# Patient Record
Sex: Female | Born: 1990 | Race: White | Hispanic: No | Marital: Married | Smoking: Never smoker
Health system: Southern US, Community
[De-identification: ages and names within clinical notes are randomized; demographics above are authoritative.]

## PROBLEM LIST (undated history)

## (undated) ENCOUNTER — Inpatient Hospital Stay (HOSPITAL_COMMUNITY): Payer: Self-pay

## (undated) DIAGNOSIS — K219 Gastro-esophageal reflux disease without esophagitis: Secondary | ICD-10-CM

## (undated) DIAGNOSIS — D649 Anemia, unspecified: Secondary | ICD-10-CM

## (undated) DIAGNOSIS — E063 Autoimmune thyroiditis: Secondary | ICD-10-CM

## (undated) DIAGNOSIS — E042 Nontoxic multinodular goiter: Secondary | ICD-10-CM

## (undated) HISTORY — DX: Autoimmune thyroiditis: E06.3

## (undated) HISTORY — PX: SHOULDER SURGERY: SHX246

## (undated) HISTORY — PX: WISDOM TOOTH EXTRACTION: SHX21

---

## 2013-10-01 ENCOUNTER — Other Ambulatory Visit: Payer: Self-pay

## 2013-10-01 LAB — OB RESULTS CONSOLE HIV ANTIBODY (ROUTINE TESTING): HIV: NONREACTIVE

## 2013-10-01 LAB — OB RESULTS CONSOLE GC/CHLAMYDIA
CHLAMYDIA, DNA PROBE: NEGATIVE
GC PROBE AMP, GENITAL: NEGATIVE

## 2013-10-01 LAB — OB RESULTS CONSOLE ABO/RH: RH Type: POSITIVE

## 2013-10-01 LAB — OB RESULTS CONSOLE HEPATITIS B SURFACE ANTIGEN: Hepatitis B Surface Ag: NEGATIVE

## 2013-10-01 LAB — OB RESULTS CONSOLE ANTIBODY SCREEN: Antibody Screen: NEGATIVE

## 2013-10-01 LAB — OB RESULTS CONSOLE RPR: RPR: NONREACTIVE

## 2013-10-01 LAB — OB RESULTS CONSOLE RUBELLA ANTIBODY, IGM: Rubella: IMMUNE

## 2013-10-06 ENCOUNTER — Other Ambulatory Visit (HOSPITAL_COMMUNITY): Payer: Self-pay | Admitting: Obstetrics and Gynecology

## 2013-10-06 DIAGNOSIS — Z8279 Family history of other congenital malformations, deformations and chromosomal abnormalities: Secondary | ICD-10-CM

## 2013-11-30 ENCOUNTER — Encounter (HOSPITAL_COMMUNITY): Payer: Self-pay

## 2013-11-30 ENCOUNTER — Ambulatory Visit (HOSPITAL_COMMUNITY): Payer: BC Managed Care – PPO

## 2013-11-30 ENCOUNTER — Ambulatory Visit (HOSPITAL_COMMUNITY)
Admission: RE | Admit: 2013-11-30 | Discharge: 2013-11-30 | Disposition: A | Discharge status: Home / Self Care | Payer: BC Managed Care – PPO | Source: Ambulatory Visit | Attending: Obstetrics and Gynecology | Admitting: Obstetrics and Gynecology

## 2013-11-30 DIAGNOSIS — O352XX0 Maternal care for (suspected) hereditary disease in fetus, not applicable or unspecified: Secondary | ICD-10-CM | POA: Insufficient documentation

## 2013-11-30 DIAGNOSIS — O358XX0 Maternal care for other (suspected) fetal abnormality and damage, not applicable or unspecified: Secondary | ICD-10-CM | POA: Insufficient documentation

## 2013-11-30 DIAGNOSIS — Z1389 Encounter for screening for other disorder: Secondary | ICD-10-CM | POA: Insufficient documentation

## 2013-11-30 DIAGNOSIS — Z8279 Family history of other congenital malformations, deformations and chromosomal abnormalities: Secondary | ICD-10-CM

## 2013-11-30 DIAGNOSIS — Z363 Encounter for antenatal screening for malformations: Secondary | ICD-10-CM | POA: Insufficient documentation

## 2014-01-18 ENCOUNTER — Inpatient Hospital Stay (HOSPITAL_COMMUNITY)
Admission: AD | Admit: 2014-01-18 | Discharge: 2014-01-18 | Disposition: A | Discharge status: Home / Self Care | Payer: BC Managed Care – PPO | Source: Ambulatory Visit | Attending: Obstetrics and Gynecology | Admitting: Obstetrics and Gynecology

## 2014-01-18 ENCOUNTER — Encounter (HOSPITAL_COMMUNITY): Payer: Self-pay

## 2014-01-18 DIAGNOSIS — N898 Other specified noninflammatory disorders of vagina: Secondary | ICD-10-CM

## 2014-01-18 DIAGNOSIS — O26899 Other specified pregnancy related conditions, unspecified trimester: Secondary | ICD-10-CM

## 2014-01-18 DIAGNOSIS — Z91013 Allergy to seafood: Secondary | ICD-10-CM | POA: Insufficient documentation

## 2014-01-18 DIAGNOSIS — B9689 Other specified bacterial agents as the cause of diseases classified elsewhere: Secondary | ICD-10-CM

## 2014-01-18 DIAGNOSIS — N760 Acute vaginitis: Secondary | ICD-10-CM

## 2014-01-18 DIAGNOSIS — O368190 Decreased fetal movements, unspecified trimester, not applicable or unspecified: Secondary | ICD-10-CM | POA: Insufficient documentation

## 2014-01-18 HISTORY — DX: Nontoxic multinodular goiter: E04.2

## 2014-01-18 LAB — URINALYSIS, ROUTINE W REFLEX MICROSCOPIC
Bilirubin Urine: NEGATIVE
Glucose, UA: NEGATIVE mg/dL
HGB URINE DIPSTICK: NEGATIVE
Ketones, ur: NEGATIVE mg/dL
NITRITE: NEGATIVE
Protein, ur: NEGATIVE mg/dL
Specific Gravity, Urine: <1.005 — ABNORMAL LOW (ref 1.005–1.030)
UROBILINOGEN UA: 0.2 mg/dL (ref 0.0–1.0)
pH: 7 (ref 5.0–8.0)

## 2014-01-18 LAB — URINE MICROSCOPIC-ADD ON

## 2014-01-18 LAB — WET PREP, GENITAL
Trich, Wet Prep: NONE SEEN
YEAST WET PREP: NONE SEEN

## 2014-01-18 MED ORDER — METRONIDAZOLE 500 MG PO TABS: 500.0000 mg | ORAL_TABLET | Freq: Two times a day (BID) | ORAL | Status: AC

## 2014-01-18 NOTE — Discharge Instructions (Signed)
Second Trimester of Pregnancy The second trimester is from week 13 through week 28, months 4 through 6. The second trimester is often a time when you feel your best. Your body has also adjusted to being pregnant, and you begin to feel better physically. Usually, morning sickness has lessened or quit completely, you may have more energy, and you may have an increase in appetite. The second trimester is also a time when the fetus is growing rapidly. At the end of the sixth month, the fetus is about 9 inches long and weighs about 1 pounds. You will likely begin to feel the baby move (quickening) between 18 and 20 weeks of the pregnancy. BODY CHANGES Your body goes through many changes during pregnancy. The changes vary from woman to woman.   Your weight will continue to increase. You will notice your lower abdomen bulging out.  You may begin to get stretch marks on your hips, abdomen, and breasts.  You may develop headaches that can be relieved by medicines approved by your caregiver.  You may urinate more often because the fetus is pressing on your bladder.  You may develop or continue to have heartburn as a result of your pregnancy.  You may develop constipation because certain hormones are causing the muscles that push waste through your intestines to slow down.  You may develop hemorrhoids or swollen, bulging veins (varicose veins).  You may have back pain because of the weight gain and pregnancy hormones relaxing your joints between the bones in your pelvis and as a result of a shift in weight and the muscles that support your balance.  Your breasts will continue to grow and be tender.  Your gums may bleed and may be sensitive to brushing and flossing.  Dark spots or blotches (chloasma, mask of pregnancy) may develop on your face. This will likely fade after the baby is born.  A dark line from your belly button to the pubic area (linea nigra) may appear. This will likely fade after the  baby is born. WHAT TO EXPECT AT YOUR PRENATAL VISITS During a routine prenatal visit:  You will be weighed to make sure you and the fetus are growing normally.  Your blood pressure will be taken.  Your abdomen will be measured to track your baby's growth.  The fetal heartbeat will be listened to.  Any test results from the previous visit will be discussed. Your caregiver may ask you:  How you are feeling.  If you are feeling the baby move.  If you have had any abnormal symptoms, such as leaking fluid, bleeding, severe headaches, or abdominal cramping.  If you have any questions. Other tests that may be performed during your second trimester include:  Blood tests that check for:  Low iron levels (anemia).  Gestational diabetes (between 24 and 28 weeks).  Rh antibodies.  Urine tests to check for infections, diabetes, or protein in the urine.  An ultrasound to confirm the proper growth and development of the baby.  An amniocentesis to check for possible genetic problems.  Fetal screens for spina bifida and Down syndrome. HOME CARE INSTRUCTIONS   Avoid all smoking, herbs, alcohol, and unprescribed drugs. These chemicals affect the formation and growth of the baby.  Follow your caregiver's instructions regarding medicine use. There are medicines that are either safe or unsafe to take during pregnancy.  Exercise only as directed by your caregiver. Experiencing uterine cramps is a good sign to stop exercising.  Continue to eat regular,   healthy meals.  Wear a good support bra for breast tenderness.  Do not use hot tubs, steam rooms, or saunas.  Wear your seat belt at all times when driving.  Avoid raw meat, uncooked cheese, cat litter boxes, and soil used by cats. These carry germs that can cause birth defects in the baby.  Take your prenatal vitamins.  Try taking a stool softener (if your caregiver approves) if you develop constipation. Eat more high-fiber foods,  such as fresh vegetables or fruit and whole grains. Drink plenty of fluids to keep your urine clear or pale yellow.  Take warm sitz baths to soothe any pain or discomfort caused by hemorrhoids. Use hemorrhoid cream if your caregiver approves.  If you develop varicose veins, wear support hose. Elevate your feet for 15 minutes, 3 4 times a day. Limit salt in your diet.  Avoid heavy lifting, wear low heel shoes, and practice good posture.  Rest with your legs elevated if you have leg cramps or low back pain.  Visit your dentist if you have not gone yet during your pregnancy. Use a soft toothbrush to brush your teeth and be gentle when you floss.  A sexual relationship may be continued unless your caregiver directs you otherwise.  Continue to go to all your prenatal visits as directed by your caregiver. SEEK MEDICAL CARE IF:   You have dizziness.  You have mild pelvic cramps, pelvic pressure, or nagging pain in the abdominal area.  You have persistent nausea, vomiting, or diarrhea.  You have a bad smelling vaginal discharge.  You have pain with urination. SEEK IMMEDIATE MEDICAL CARE IF:   You have a fever.  You are leaking fluid from your vagina.  You have spotting or bleeding from your vagina.  You have severe abdominal cramping or pain.  You have rapid weight gain or loss.  You have shortness of breath with chest pain.  You notice sudden or extreme swelling of your face, hands, ankles, feet, or legs.  You have not felt your baby move in over an hour.  You have severe headaches that do not go away with medicine.  You have vision changes. Document Released: 10/22/2001 Document Revised: 06/30/2013 Document Reviewed: 12/29/2012 [employer] Patient Information 2014 [employer].  

## 2014-01-18 NOTE — MAU Note (Signed)
Last felt fetal movement last night before bed; faint movements yesterday. Denies contractions/vaginal bleeding. Change in vaginal discharge; clear & watery since yesterday. Denies fever.

## 2014-01-18 NOTE — MAU Provider Note (Signed)
History     CSN: [id number]  Arrival date and time: 01/18/14 4098   First Provider Initiated Contact with Patient 01/18/14 2004      Chief Complaint  Patient presents with  . Decreased Fetal Movement  . Vaginal Discharge   Vaginal Discharge The patient's primary symptoms include a vaginal discharge.    23 yo G1 @ [redacted]w[redacted]d who presents today for decreased FM and change in vaginal discharge.  States that starting yesterday, she has noticed that her discharge went from being thick to more watery and thin discharge. Then this AM, she noticed that her baby wasn't kicking as strongly or as often as earlier and the combination of the two made her worry.  No contractions or vaginal bleeding.    Since getting here baby has been moving normally again "almost as soon as the monitor was put on".    No fevers, chills, dysuria.    OB History   Grav Para Term Preterm Abortions TAB SAB Ect Mult Living   1         0      Past Medical History  Diagnosis Date  . Multiple thyroid nodules     Has to have them checked once a year    Past Surgical History  Procedure Laterality Date  . Shoulder surgery Right   . Wisdom tooth extraction      Family History  Problem Relation Age of Onset  . Hypertension Father     History  Substance Use Topics  . Smoking status: Never Smoker   . Smokeless tobacco: Never Used  . Alcohol Use: No    Allergies:  Allergies  Allergen Reactions  . Codeine   . Erythromycin   . Food     Potatoes and sesame seeds  . Shellfish Allergy     Prescriptions prior to admission  Medication Sig Dispense Refill  . Prenatal Vit-Fe Fumarate-FA (PRENATAL MULTIVITAMIN) TABS tablet Take 1 tablet by mouth daily at 12 noon.        Review of Systems  Genitourinary: Positive for vaginal discharge.  All other systems reviewed and are negative.   Physical Exam   Blood pressure 125/87, pulse 88, temperature 99.2 F (37.3 C), temperature source Oral, resp. rate 18,  height 5\' 5"  (1.651 m), weight 60.056 kg (132 lb 6.4 oz), last menstrual period 07/19/2013, SpO2 100.00%.  Physical Exam  Constitutional: She is oriented to person, place, and time. She appears well-developed and well-nourished.  HENT:  Mouth/Throat: Oropharynx is clear and moist.  Neck: Neck supple.  Cardiovascular: Normal rate, regular rhythm and normal heart sounds.   Respiratory: Effort normal and breath sounds normal.  GI: Soft. Bowel sounds are normal. She exhibits no distension. There is no tenderness. There is no rebound.  Genitourinary: Uterus is enlarged (appropriate for dates). Cervix exhibits friability (large ectropion). Cervix exhibits no discharge. Right adnexum displays no mass and no tenderness. Left adnexum displays no mass and no tenderness. No erythema, tenderness or bleeding around the vagina. Vaginal discharge (scant, watery ) found.  Negative pool and negative fern  Neurological: She is alert and oriented to person, place, and time.  Skin: Skin is warm and dry.    MAU Course  Procedures  MDM Wet prep Gc/chl Prolonged monitoring   Assessment and Plan  23 yo G1 @ [redacted]w[redacted]d who is here for change in vaginal discharge and decreased FM now resolved.   1) decreased FM - resolved upon arrival here - FHT 145, mod var,  10x10 accels, one variable. Reassuring for GA - reassurance given  2) vaginal discharge - normal appearance on exam - neg fern, neg pool - wet prep showing clue cells. Will treat with flagyl po BID x 7 days.  - gc/chl pending  D/c to home with return precautions. F/u in office as scheduled.    Discussed with Dr. Arelia SneddonMcComb.      Eulice Rutledge L 01/18/2014, 8:28 PM

## 2014-01-19 LAB — GC/CHLAMYDIA PROBE AMP
CT Probe RNA: NEGATIVE
GC Probe RNA: NEGATIVE

## 2014-02-17 ENCOUNTER — Other Ambulatory Visit (HOSPITAL_COMMUNITY): Payer: Self-pay | Admitting: Obstetrics and Gynecology

## 2014-02-17 DIAGNOSIS — O352XX0 Maternal care for (suspected) hereditary disease in fetus, not applicable or unspecified: Secondary | ICD-10-CM

## 2014-03-08 ENCOUNTER — Ambulatory Visit (HOSPITAL_COMMUNITY): Payer: BC Managed Care – PPO

## 2014-03-13 ENCOUNTER — Encounter (HOSPITAL_COMMUNITY): Payer: Self-pay

## 2014-03-13 ENCOUNTER — Inpatient Hospital Stay (HOSPITAL_COMMUNITY)
Admission: AD | Admit: 2014-03-13 | Discharge: 2014-03-13 | Disposition: A | Discharge status: Home / Self Care | Payer: BC Managed Care – PPO | Source: Ambulatory Visit | Attending: Obstetrics & Gynecology | Admitting: Obstetrics & Gynecology

## 2014-03-13 DIAGNOSIS — O479 False labor, unspecified: Secondary | ICD-10-CM

## 2014-03-13 DIAGNOSIS — O99891 Other specified diseases and conditions complicating pregnancy: Secondary | ICD-10-CM | POA: Insufficient documentation

## 2014-03-13 DIAGNOSIS — O4700 False labor before 37 completed weeks of gestation, unspecified trimester: Secondary | ICD-10-CM | POA: Insufficient documentation

## 2014-03-13 DIAGNOSIS — O9989 Other specified diseases and conditions complicating pregnancy, childbirth and the puerperium: Secondary | ICD-10-CM

## 2014-03-13 DIAGNOSIS — N898 Other specified noninflammatory disorders of vagina: Secondary | ICD-10-CM | POA: Insufficient documentation

## 2014-03-13 DIAGNOSIS — O26893 Other specified pregnancy related conditions, third trimester: Secondary | ICD-10-CM

## 2014-03-13 LAB — URINE MICROSCOPIC-ADD ON

## 2014-03-13 LAB — URINALYSIS, ROUTINE W REFLEX MICROSCOPIC
Bilirubin Urine: NEGATIVE
GLUCOSE, UA: NEGATIVE mg/dL
Hgb urine dipstick: NEGATIVE
KETONES UR: NEGATIVE mg/dL
NITRITE: NEGATIVE
PH: 7 (ref 5.0–8.0)
Protein, ur: NEGATIVE mg/dL
Specific Gravity, Urine: 1.02 (ref 1.005–1.030)
Urobilinogen, UA: 0.2 mg/dL (ref 0.0–1.0)

## 2014-03-13 NOTE — MAU Provider Note (Signed)
Chief Complaint:  lost mucus plug   First Provider Initiated Contact with Patient 03/13/14 1221     HPI: Alison Duran is a 23 y.o. G1P0 at 4420w6d who presents to maternity admissions reporting passing brown mucus yesterday. None today. Has been having Deberah PeltonBraxton Hicks for a few months. No increase in intensity or frequency. Unable to describe the frequency. Denies leakage of fluid. Good fetal movement. Placenta anterior per anatomy scan. Blood type A pos. No recent IC.   Pregnancy Course: Uncomplicated.  Past Medical History: Past Medical History  Diagnosis Date  . Multiple thyroid nodules     Has to have them checked once a year    Past obstetric history: OB History  Gravida Para Term Preterm AB SAB TAB Ectopic Multiple Living  1         0    # Outcome Date GA Lbr Len/2nd Weight Sex Delivery Anes PTL Lv  1 CUR               Past Surgical History: Past Surgical History  Procedure Laterality Date  . Shoulder surgery Right   . Wisdom tooth extraction       Family History: Family History  Problem Relation Age of Onset  . Hypertension Father     Social History: History  Substance Use Topics  . Smoking status: Never Smoker   . Smokeless tobacco: Never Used  . Alcohol Use: No    Allergies:  Allergies  Allergen Reactions  . Codeine Nausea And Vomiting  . Erythromycin Nausea And Vomiting  . Shellfish Allergy Other (See Comments)    Pt preference    Meds:  Prescriptions prior to admission  Medication Sig Dispense Refill  . IRON PO Take 1 tablet by mouth 2 (two) times daily.      . Prenatal Vit-Fe Fumarate-FA (PRENATAL MULTIVITAMIN) TABS tablet Take 1 tablet by mouth at bedtime.         ROS: Pertinent findings in history of present illness. Neg for fever, chills, vaginal discharge, urinary complaints, LOF, GI complaints.   Physical Exam  Blood pressure 114/68, pulse 81, temperature 98.5 F (36.9 C), resp. rate 18, height 5\' 4"  (1.626 m), weight 64.864 kg (143  lb), last menstrual period 07/19/2013. GENERAL: Well-developed, well-nourished female in no acute distress.  HEENT: normocephalic HEART: normal rate RESP: normal effort ABDOMEN: Soft, non-tender, gravid appropriate for gestational age EXTREMITIES: Nontender, no edema NEURO: alert and oriented SPECULUM EXAM: NEFG, physiologic discharge, no blood, cervix clean.1 cm ectropion noted. Non-friable.  Dilation: Closed Effacement (%): Thick Cervical Position: Posterior Station: Ballotable Presentation: Vertex Exam by:: Dorathy KinsmanVirginia Jaymi Tinner CNM Dilation: Closed Effacement (%): Thick Cervical Position: Posterior Station: Ballotable Presentation: Vertex Exam by:: Dorathy KinsmanVirginia Anayia Eugene CNM  FHT:  Baseline 150 , moderate variability, accelerations present, no decelerations Contractions: UI   Labs: Results for orders placed during the hospital encounter of 03/13/14 (from the past 24 hour(s))  URINALYSIS, ROUTINE W REFLEX MICROSCOPIC     Status: Abnormal   Collection Time    03/13/14 12:05 PM      Result Value Ref Range   Color, Urine YELLOW  YELLOW   APPearance CLEAR  CLEAR   Specific Gravity, Urine 1.020  1.005 - 1.030   pH 7.0  5.0 - 8.0   Glucose, UA NEGATIVE  NEGATIVE mg/dL   Hgb urine dipstick NEGATIVE  NEGATIVE   Bilirubin Urine NEGATIVE  NEGATIVE   Ketones, ur NEGATIVE  NEGATIVE mg/dL   Protein, ur NEGATIVE  NEGATIVE mg/dL  Urobilinogen, UA 0.2  0.0 - 1.0 mg/dL   Nitrite NEGATIVE  NEGATIVE   Leukocytes, UA SMALL (*) NEGATIVE  URINE MICROSCOPIC-ADD ON     Status: Abnormal   Collection Time    03/13/14 12:05 PM      Result Value Ref Range   Squamous Epithelial / LPF FEW (*) RARE   WBC, UA 3-6  <3 WBC/hpf   Bacteria, UA RARE  RARE   Urine-Other MUCOUS PRESENT      Imaging:  No results found.  MAU Course:  Assessment: 1. Vaginal discharge in pregnancy in third trimester-mucus plug   2. Braxton Hicks contractions    Plan: Discharge home in stable condition per consult w/ Dr.  Langston MaskerMorris. Preterm labor precautions and fetal kick counts. Increase fluids and rest. Pelvic rest x1 week.     Follow-up Information   [facility] (As scheduled or, As needed, If symptoms worsen)    Contact information:   [address] [place] [phone]      [facility] with [facility]. (As needed, If symptoms worsen)    Contact information:   [address] [id number] [place] [id number] [phone]       Medication List         IRON PO  Take 1 tablet by mouth 2 (two) times daily.     prenatal multivitamin Tabs tablet  Take 1 tablet by mouth at bedtime.        [place]wood[place]ia Kirin Pastorino, CNM 03/13/2014 1:21 PM

## 2014-03-13 NOTE — Discharge Instructions (Signed)
Braxton Hicks Contractions Pregnancy is commonly associated with contractions of the uterus throughout the pregnancy. Towards the end of pregnancy (32 to 34 weeks), these contractions (Braxton Hicks) can develop more often and may become more forceful. This is not true labor because these contractions do not result in opening (dilatation) and thinning of the cervix. They are sometimes difficult to tell apart from true labor because these contractions can be forceful and people have different pain tolerances. You should not feel embarrassed if you go to the hospital with false labor. Sometimes, the only way to tell if you are in true labor is for your caregiver to follow the changes in the cervix. How to tell the difference between true and false labor:  False labor.  The contractions of false labor are usually shorter, irregular and not as hard as those of true labor.  They are often felt in the front of the lower abdomen and in the groin.  They may leave with walking around or changing positions while lying down.  They get weaker and are shorter lasting as time goes on.  These contractions are usually irregular.  They do not usually become progressively stronger, regular and closer together as with true labor.  True labor.  Contractions in true labor last 30 to 70 seconds, become very regular, usually become more intense, and increase in frequency.  They do not go away with walking.  The discomfort is usually felt in the top of the uterus and spreads to the lower abdomen and low back.  True labor can be determined by your caregiver with an exam. This will show that the cervix is dilating and getting thinner. If there are no prenatal problems or other health problems associated with the pregnancy, it is completely safe to be sent home with false labor and await the onset of true labor. HOME CARE INSTRUCTIONS   Keep up with your usual exercises and instructions.  Take medications as  directed.  Keep your regular prenatal appointment.  Eat and drink lightly if you think you are going into labor.  If BH contractions are making you uncomfortable:  Change your activity position from lying down or resting to walking/walking to resting.  Sit and rest in a tub of warm water.  Drink 2 to 3 glasses of water. Dehydration may cause B-H contractions.  Do slow and deep breathing several times an hour. SEEK IMMEDIATE MEDICAL CARE IF:   Your contractions continue to become stronger, more regular, and closer together.  You have a gushing, burst or leaking of fluid from the vagina.  An oral temperature above 102 F (38.9 C) develops.  You have passage of blood-tinged mucus.  You develop vaginal bleeding.  You develop continuous belly (abdominal) pain.  You have low back pain that you never had before.  You feel the baby's head pushing down causing pelvic pressure.  The baby is not moving as much as it used to. Document Released: 10/28/2005 Document Revised: 01/20/2012 Document Reviewed: 08/09/2013 [employer] Patient Information 2014 [employer].  Fetal Movement Counts Patient Name: __________________________________________________ Patient Due Date: ____________________ Performing a fetal movement count is highly recommended in high-risk pregnancies, but it is good for every pregnant woman to do. Your caregiver may ask you to start counting fetal movements at 28 weeks of the pregnancy. Fetal movements often increase:  After eating a full meal.  After physical activity.  After eating or drinking something sweet or cold.  At rest. Pay attention to when you feel   the baby is most active. This will help you notice a pattern of your baby's sleep and wake cycles and what factors contribute to an increase in fetal movement. It is important to perform a fetal movement count at the same time each day when your baby is normally most active.  HOW TO COUNT FETAL  MOVEMENTS 1. Find a quiet and comfortable area to sit or lie down on your left side. Lying on your left side provides the best blood and oxygen circulation to your baby. 2. Write down the day and time on a sheet of paper or in a journal. 3. Start counting kicks, flutters, swishes, rolls, or jabs in a 2 hour period. You should feel at least 10 movements within 2 hours. 4. If you do not feel 10 movements in 2 hours, wait 2 3 hours and count again. Look for a change in the pattern or not enough counts in 2 hours. SEEK MEDICAL CARE IF:  You feel less than 10 counts in 2 hours, tried twice.  There is no movement in over an hour.  The pattern is changing or taking longer each day to reach 10 counts in 2 hours.  You feel the baby is not moving as he or she usually does. Date: ____________ Movements: ____________ Start time: ____________ Finish time: ____________  Date: ____________ Movements: ____________ Start time: ____________ Finish time: ____________ Date: ____________ Movements: ____________ Start time: ____________ Finish time: ____________ Date: ____________ Movements: ____________ Start time: ____________ Finish time: ____________ Date: ____________ Movements: ____________ Start time: ____________ Finish time: ____________ Date: ____________ Movements: ____________ Start time: ____________ Finish time: ____________ Date: ____________ Movements: ____________ Start time: ____________ Finish time: ____________ Date: ____________ Movements: ____________ Start time: ____________ Finish time: ____________  Date: ____________ Movements: ____________ Start time: ____________ Finish time: ____________ Date: ____________ Movements: ____________ Start time: ____________ Finish time: ____________ Date: ____________ Movements: ____________ Start time: ____________ Finish time: ____________ Date: ____________ Movements: ____________ Start time: ____________ Finish time: ____________ Date: ____________  Movements: ____________ Start time: ____________ Finish time: ____________ Date: ____________ Movements: ____________ Start time: ____________ Finish time: ____________ Date: ____________ Movements: ____________ Start time: ____________ Finish time: ____________  Date: ____________ Movements: ____________ Start time: ____________ Finish time: ____________ Date: ____________ Movements: ____________ Start time: ____________ Finish time: ____________ Date: ____________ Movements: ____________ Start time: ____________ Finish time: ____________ Date: ____________ Movements: ____________ Start time: ____________ Finish time: ____________ Date: ____________ Movements: ____________ Start time: ____________ Finish time: ____________ Date: ____________ Movements: ____________ Start time: ____________ Finish time: ____________ Date: ____________ Movements: ____________ Start time: ____________ Finish time: ____________  Date: ____________ Movements: ____________ Start time: ____________ Finish time: ____________ Date: ____________ Movements: ____________ Start time: ____________ Finish time: ____________ Date: ____________ Movements: ____________ Start time: ____________ Finish time: ____________ Date: ____________ Movements: ____________ Start time: ____________ Finish time: ____________ Date: ____________ Movements: ____________ Start time: ____________ Finish time: ____________ Date: ____________ Movements: ____________ Start time: ____________ Finish time: ____________ Date: ____________ Movements: ____________ Start time: ____________ Finish time: ____________  Date: ____________ Movements: ____________ Start time: ____________ Finish time: ____________ Date: ____________ Movements: ____________ Start time: ____________ Finish time: ____________ Date: ____________ Movements: ____________ Start time: ____________ Finish time: ____________ Date: ____________ Movements: ____________ Start time:  ____________ Finish time: ____________ Date: ____________ Movements: ____________ Start time: ____________ Finish time: ____________ Date: ____________ Movements: ____________ Start time: ____________ Finish time: ____________ Date: ____________ Movements: ____________ Start time: ____________ Finish time: ____________  Date: ____________ Movements: ____________ Start time: ____________ Finish time: ____________ Date: ____________ Movements: ____________ Start   time: ____________ Finish time: ____________ Date: ____________ Movements: ____________ Start time: ____________ Finish time: ____________ Date: ____________ Movements: ____________ Start time: ____________ Finish time: ____________ Date: ____________ Movements: ____________ Start time: ____________ Finish time: ____________ Date: ____________ Movements: ____________ Start time: ____________ Finish time: ____________ Date: ____________ Movements: ____________ Start time: ____________ Finish time: ____________  Date: ____________ Movements: ____________ Start time: ____________ Finish time: ____________ Date: ____________ Movements: ____________ Start time: ____________ Finish time: ____________ Date: ____________ Movements: ____________ Start time: ____________ Finish time: ____________ Date: ____________ Movements: ____________ Start time: ____________ Finish time: ____________ Date: ____________ Movements: ____________ Start time: ____________ Finish time: ____________ Date: ____________ Movements: ____________ Start time: ____________ Finish time: ____________ Date: ____________ Movements: ____________ Start time: ____________ Finish time: ____________  Date: ____________ Movements: ____________ Start time: ____________ Finish time: ____________ Date: ____________ Movements: ____________ Start time: ____________ Finish time: ____________ Date: ____________ Movements: ____________ Start time: ____________ Finish time: ____________ Date:  ____________ Movements: ____________ Start time: ____________ Finish time: ____________ Date: ____________ Movements: ____________ Start time: ____________ Finish time: ____________ Date: ____________ Movements: ____________ Start time: ____________ Finish time: ____________ Document Released: 11/27/2006 Document Revised: 10/14/2012 Document Reviewed: 08/24/2012 [employer] Patient Information 2014 [employer].  

## 2014-03-13 NOTE — MAU Note (Signed)
Pt presents to MAU with complaints of passing her mucus plug yesterday and was concerned so she called and the on call nurse told her to come in and be evaluated

## 2014-03-15 ENCOUNTER — Ambulatory Visit (HOSPITAL_COMMUNITY)
Admission: RE | Admit: 2014-03-15 | Discharge: 2014-03-15 | Disposition: A | Discharge status: Home / Self Care | Payer: BC Managed Care – PPO | Source: Ambulatory Visit | Attending: Obstetrics and Gynecology | Admitting: Obstetrics and Gynecology

## 2014-03-15 ENCOUNTER — Encounter (HOSPITAL_COMMUNITY): Payer: Self-pay

## 2014-03-15 DIAGNOSIS — Z3689 Encounter for other specified antenatal screening: Secondary | ICD-10-CM | POA: Insufficient documentation

## 2014-03-15 DIAGNOSIS — O352XX0 Maternal care for (suspected) hereditary disease in fetus, not applicable or unspecified: Secondary | ICD-10-CM | POA: Insufficient documentation

## 2014-04-12 LAB — OB RESULTS CONSOLE GBS: GBS: POSITIVE

## 2014-04-14 ENCOUNTER — Encounter (HOSPITAL_COMMUNITY): Payer: BC Managed Care – PPO | Admitting: Anesthesiology

## 2014-04-14 ENCOUNTER — Inpatient Hospital Stay (HOSPITAL_COMMUNITY)
Admission: AD | Admit: 2014-04-14 | Discharge: 2014-04-17 | DRG: 774 | Disposition: A | Discharge status: Home / Self Care | Payer: BC Managed Care – PPO | Source: Ambulatory Visit | Attending: Obstetrics and Gynecology | Admitting: Obstetrics and Gynecology

## 2014-04-14 ENCOUNTER — Encounter (HOSPITAL_COMMUNITY): Payer: Self-pay | Admitting: *Deleted

## 2014-04-14 ENCOUNTER — Inpatient Hospital Stay (HOSPITAL_COMMUNITY): Payer: BC Managed Care – PPO | Admitting: Anesthesiology

## 2014-04-14 DIAGNOSIS — O1414 Severe pre-eclampsia complicating childbirth: Principal | ICD-10-CM | POA: Diagnosis present

## 2014-04-14 DIAGNOSIS — K219 Gastro-esophageal reflux disease without esophagitis: Secondary | ICD-10-CM | POA: Diagnosis present

## 2014-04-14 DIAGNOSIS — Z8249 Family history of ischemic heart disease and other diseases of the circulatory system: Secondary | ICD-10-CM

## 2014-04-14 DIAGNOSIS — O9902 Anemia complicating childbirth: Secondary | ICD-10-CM | POA: Diagnosis present

## 2014-04-14 DIAGNOSIS — D649 Anemia, unspecified: Secondary | ICD-10-CM | POA: Diagnosis present

## 2014-04-14 HISTORY — DX: Anemia, unspecified: D64.9

## 2014-04-14 HISTORY — DX: Gastro-esophageal reflux disease without esophagitis: K21.9

## 2014-04-14 LAB — CBC
HEMATOCRIT: 32.4 % — AB (ref 36.0–46.0)
HEMATOCRIT: 35 % — AB (ref 36.0–46.0)
Hemoglobin: 10.7 g/dL — ABNORMAL LOW (ref 12.0–15.0)
Hemoglobin: 11.4 g/dL — ABNORMAL LOW (ref 12.0–15.0)
MCH: 30.4 pg (ref 26.0–34.0)
MCH: 30.3 pg (ref 26.0–34.0)
MCHC: 33 g/dL (ref 30.0–36.0)
MCHC: 32.6 g/dL (ref 30.0–36.0)
MCV: 92 fL (ref 78.0–100.0)
MCV: 93.1 fL (ref 78.0–100.0)
PLATELETS: 148 10*3/uL — AB (ref 150–400)
Platelets: 144 10*3/uL — ABNORMAL LOW (ref 150–400)
RBC: 3.52 MIL/uL — AB (ref 3.87–5.11)
RBC: 3.76 MIL/uL — ABNORMAL LOW (ref 3.87–5.11)
RDW: 13.8 % (ref 11.5–15.5)
RDW: 13.8 % (ref 11.5–15.5)
WBC: 9.6 10*3/uL (ref 4.0–10.5)
WBC: 11.6 10*3/uL — ABNORMAL HIGH (ref 4.0–10.5)

## 2014-04-14 LAB — COMPREHENSIVE METABOLIC PANEL
ALK PHOS: 159 U/L — AB (ref 39–117)
ALT: 18 U/L (ref 0–35)
AST: 20 U/L (ref 0–37)
Albumin: 3 g/dL — ABNORMAL LOW (ref 3.5–5.2)
BUN: 9 mg/dL (ref 6–23)
CO2: 22 meq/L (ref 19–32)
Calcium: 9.2 mg/dL (ref 8.4–10.5)
Chloride: 103 mEq/L (ref 96–112)
Creatinine, Ser: 0.72 mg/dL (ref 0.50–1.10)
GLUCOSE: 72 mg/dL (ref 70–99)
POTASSIUM: 4.4 meq/L (ref 3.7–5.3)
SODIUM: 138 meq/L (ref 137–147)
Total Bilirubin: 0.3 mg/dL (ref 0.3–1.2)
Total Protein: 6 g/dL (ref 6.0–8.3)

## 2014-04-14 LAB — TYPE AND SCREEN
ABO/RH(D): A POS
Antibody Screen: NEGATIVE

## 2014-04-14 LAB — RPR

## 2014-04-14 LAB — URIC ACID: Uric Acid, Serum: 5 mg/dL (ref 2.4–7.0)

## 2014-04-14 MED ORDER — TERBUTALINE SULFATE 1 MG/ML IJ SOLN: 0.2500 mg | Freq: Once | INTRAMUSCULAR | Status: AC | PRN

## 2014-04-14 MED ORDER — PENICILLIN G POTASSIUM 5000000 UNITS IJ SOLR
5.0000 10*6.[IU] | Freq: Once | INTRAVENOUS | Status: AC
  Administered 2014-04-14: 5 10*6.[IU] via INTRAVENOUS
  Filled 2014-04-14: qty 5

## 2014-04-14 MED ORDER — PENICILLIN G POTASSIUM 5000000 UNITS IJ SOLR
2.5000 10*6.[IU] | INTRAMUSCULAR | Status: DC
  Administered 2014-04-14 (×2): 2.5 10*6.[IU] via INTRAVENOUS
  Filled 2014-04-14 (×6): qty 2.5

## 2014-04-14 MED ORDER — PHENYLEPHRINE 40 MCG/ML (10ML) SYRINGE FOR IV PUSH (FOR BLOOD PRESSURE SUPPORT)
80.0000 ug | PREFILLED_SYRINGE | INTRAVENOUS | Status: DC | PRN
  Filled 2014-04-14: qty 2

## 2014-04-14 MED ORDER — LIDOCAINE HCL (PF) 1 % IJ SOLN
30.0000 mL | INTRAMUSCULAR | Status: DC | PRN
  Filled 2014-04-14: qty 30

## 2014-04-14 MED ORDER — PHENYLEPHRINE 40 MCG/ML (10ML) SYRINGE FOR IV PUSH (FOR BLOOD PRESSURE SUPPORT)
80.0000 ug | PREFILLED_SYRINGE | INTRAVENOUS | Status: DC | PRN
  Filled 2014-04-14: qty 2
  Filled 2014-04-14: qty 10

## 2014-04-14 MED ORDER — CITRIC ACID-SODIUM CITRATE 334-500 MG/5ML PO SOLN: 30.0000 mL | ORAL | Status: DC | PRN

## 2014-04-14 MED ORDER — ONDANSETRON HCL 4 MG/2ML IJ SOLN: 4.0000 mg | Freq: Four times a day (QID) | INTRAMUSCULAR | Status: DC | PRN

## 2014-04-14 MED ORDER — EPHEDRINE 5 MG/ML INJ
10.0000 mg | INTRAVENOUS | Status: DC | PRN
  Filled 2014-04-14: qty 2
  Filled 2014-04-14: qty 4

## 2014-04-14 MED ORDER — FLEET ENEMA 7-19 GM/118ML RE ENEM: 1.0000 | ENEMA | RECTAL | Status: DC | PRN

## 2014-04-14 MED ORDER — DIPHENHYDRAMINE HCL 50 MG/ML IJ SOLN: 12.5000 mg | INTRAMUSCULAR | Status: DC | PRN

## 2014-04-14 MED ORDER — OXYTOCIN BOLUS FROM INFUSION
500.0000 mL | INTRAVENOUS | Status: DC
  Administered 2014-04-15: 500 mL via INTRAVENOUS

## 2014-04-14 MED ORDER — OXYTOCIN 40 UNITS IN LACTATED RINGERS INFUSION - SIMPLE MED: 62.5000 mL/h | INTRAVENOUS | Status: DC

## 2014-04-14 MED ORDER — FENTANYL 2.5 MCG/ML BUPIVACAINE 1/10 % EPIDURAL INFUSION (WH - ANES)
14.0000 mL/h | INTRAMUSCULAR | Status: DC | PRN
  Administered 2014-04-14: 14 mL/h via EPIDURAL
  Filled 2014-04-14: qty 125

## 2014-04-14 MED ORDER — IBUPROFEN 600 MG PO TABS: 600.0000 mg | ORAL_TABLET | Freq: Four times a day (QID) | ORAL | Status: DC | PRN

## 2014-04-14 MED ORDER — LACTATED RINGERS IV SOLN: 500.0000 mL | INTRAVENOUS | Status: DC | PRN

## 2014-04-14 MED ORDER — MAGNESIUM SULFATE BOLUS VIA INFUSION
4.0000 g | Freq: Once | INTRAVENOUS | Status: AC
  Administered 2014-04-14: 4 g via INTRAVENOUS
  Filled 2014-04-14: qty 500

## 2014-04-14 MED ORDER — LACTATED RINGERS IV SOLN
INTRAVENOUS | Status: DC
  Administered 2014-04-14 – 2014-04-15 (×2): via INTRAVENOUS

## 2014-04-14 MED ORDER — OXYTOCIN 40 UNITS IN LACTATED RINGERS INFUSION - SIMPLE MED
1.0000 m[IU]/min | INTRAVENOUS | Status: DC
  Administered 2014-04-14: 4 m[IU]/min via INTRAVENOUS
  Administered 2014-04-14: 2 m[IU]/min via INTRAVENOUS
  Filled 2014-04-14: qty 1000

## 2014-04-14 MED ORDER — ACETAMINOPHEN 325 MG PO TABS: 650.0000 mg | ORAL_TABLET | ORAL | Status: DC | PRN

## 2014-04-14 MED ORDER — EPHEDRINE 5 MG/ML INJ
10.0000 mg | INTRAVENOUS | Status: DC | PRN
  Filled 2014-04-14: qty 2

## 2014-04-14 MED ORDER — LACTATED RINGERS IV SOLN
500.0000 mL | Freq: Once | INTRAVENOUS | Status: AC
  Administered 2014-04-14: 500 mL via INTRAVENOUS

## 2014-04-14 MED ORDER — MAGNESIUM SULFATE 40 G IN LACTATED RINGERS - SIMPLE
2.0000 g/h | INTRAVENOUS | Status: DC
  Filled 2014-04-14: qty 500

## 2014-04-14 MED ORDER — LIDOCAINE HCL (PF) 1 % IJ SOLN
INTRAMUSCULAR | Status: DC | PRN
  Administered 2014-04-14 (×4): 4 mL

## 2014-04-14 MED ORDER — OXYCODONE-ACETAMINOPHEN 5-325 MG PO TABS: 1.0000 | ORAL_TABLET | ORAL | Status: DC | PRN

## 2014-04-14 MED ORDER — BUTORPHANOL TARTRATE 1 MG/ML IJ SOLN: 1.0000 mg | INTRAMUSCULAR | Status: DC | PRN

## 2014-04-14 NOTE — Progress Notes (Signed)
FHT reactive Cx 4/70/-2/vtx AROM clear UCs q61min

## 2014-04-14 NOTE — Anesthesia Preprocedure Evaluation (Signed)
Anesthesia Evaluation  Patient identified by MRN, date of birth, ID band Patient awake    Reviewed: Allergy & Precautions, H&P , NPO status , Patient's Chart, lab work & pertinent test results, reviewed documented beta blocker date and time   History of Anesthesia Complications Negative for: history of anesthetic complications  Airway Mallampati: III TM Distance: >3 FB Neck ROM: full    Dental  (+) Teeth Intact   Pulmonary neg pulmonary ROS,  breath sounds clear to auscultation        Cardiovascular hypertension (preeclampsia on magnesium), Rhythm:regular Rate:Normal     Neuro/Psych negative neurological ROS  negative psych ROS   GI/Hepatic Neg liver ROS, GERD-  ,  Endo/Other  negative endocrine ROS  Renal/GU negative Renal ROS     Musculoskeletal   Abdominal   Peds  Hematology  (+) anemia , plt 148   Anesthesia Other Findings   Reproductive/Obstetrics (+) Pregnancy                           Anesthesia Physical Anesthesia Plan  ASA: II  Anesthesia Plan: Epidural   Post-op Pain Management:    Induction:   Airway Management Planned:   Additional Equipment:   Intra-op Plan:   Post-operative Plan:   Informed Consent: I have reviewed the patients History and Physical, chart, labs and discussed the procedure including the risks, benefits and alternatives for the proposed anesthesia with the patient or authorized representative who has indicated his/her understanding and acceptance.     Plan Discussed with:   Anesthesia Plan Comments:         Anesthesia Quick Evaluation

## 2014-04-14 NOTE — Anesthesia Procedure Notes (Signed)
Epidural Patient location during procedure: OB Start time: 04/14/2014 10:38 PM  Staffing Performed by: anesthesiologist   Preanesthetic Checklist Completed: patient identified, site marked, surgical consent, pre-op evaluation, timeout performed, IV checked, risks and benefits discussed and monitors and equipment checked  Epidural Patient position: sitting Prep: site prepped and draped and DuraPrep Patient monitoring: continuous pulse ox and blood pressure Approach: midline Injection technique: LOR air  Needle:  Needle type: Tuohy  Needle gauge: 17 G Needle length: 9 cm and 9 Needle insertion depth: 5 cm cm Catheter type: closed end flexible Catheter size: 19 Gauge Catheter at skin depth: 10 cm Test dose: negative  Assessment Events: blood not aspirated, injection not painful, no injection resistance, negative IV test and no paresthesia  Additional Notes Discussed risk of headache, infection, bleeding, nerve injury and failed or incomplete block.  Patient voices understanding and wishes to proceed.  Epidural placed easily on first attempt.  No paresthesia.  Patient tolerated procedure well with no apparent complications.  Jasmine December, MDReason for block:procedure for pain

## 2014-04-14 NOTE — H&P (Signed)
Alison Duran is a 23 y.o. female presenting for IOL for preeclampsia. BP yesterday in office = 130/94. Today BP= 140/106, urine protein negative. Patient C/O frontal HA, floaters in vision and "generally doesn't feel good". No epigastric pain, no ROM. + spotting after Cx check yesterday. Hx of thyroid nodules with normal TFT this pregnancy. Maternal Medical History:  Fetal activity: Perceived fetal activity is normal.      OB History   Grav Para Term Preterm Abortions TAB SAB Ect Mult Living   1 0 0 0 0 0 0 0 0 0      Past Medical History  Diagnosis Date  . Multiple thyroid nodules     Has to have them checked once a year  . GERD (gastroesophageal reflux disease)     with pregnancy only; takes no meds  . Anemia     takes Iron twice daily   Past Surgical History  Procedure Laterality Date  . Shoulder surgery Right   . Wisdom tooth extraction     Family History: family history includes Hypertension in her father. Social History:  reports that she has never smoked. She has never used smokeless tobacco. She reports that she does not drink alcohol or use illicit drugs.   Prenatal Transfer Tool  Maternal Diabetes: No Genetic Screening: Normal Maternal Ultrasounds/Referrals: Normal Fetal Ultrasounds or other Referrals:  None Maternal Substance Abuse:  No Significant Maternal Medications:  None Significant Maternal Lab Results:  None Other Comments:  None  Review of Systems  Eyes:       Floaters this am  Gastrointestinal: Negative for abdominal pain.  Neurological: Positive for headaches.      Last menstrual period 07/19/2013. Maternal Exam:  Abdomen: Fetal presentation: vertex     Physical Exam  Cardiovascular: Normal rate and regular rhythm.   Respiratory: Effort normal and breath sounds normal.  GI: Soft. There is tenderness.  Genitourinary:  Cx 3-4/50/-2/Vtx  Neurological: She has normal reflexes.    Prenatal labs: ABO, Rh:   Antibody:   Rubella:   RPR:     HBsAg:    HIV:    GBS:     Assessment/Plan: 23 yo G1P0 with severe preeclampsia on basis of BP and HA D/W patient induction of labor and risks  D/W magnesium Sulfate for Sz prophylaxis before and after delivery  All questions answered.   Roselle Locus II 04/14/2014, 12:43 PM

## 2014-04-14 NOTE — Progress Notes (Signed)
Cx 4/80/-2 IUPC placed FHT reactive BPs stable Magnesium running DTR 2+

## 2014-04-15 ENCOUNTER — Encounter (HOSPITAL_COMMUNITY): Payer: Self-pay | Admitting: *Deleted

## 2014-04-15 LAB — CBC
HEMATOCRIT: 31.4 % — AB (ref 36.0–46.0)
HEMATOCRIT: 27.7 % — AB (ref 36.0–46.0)
Hemoglobin: 10.3 g/dL — ABNORMAL LOW (ref 12.0–15.0)
Hemoglobin: 9.2 g/dL — ABNORMAL LOW (ref 12.0–15.0)
MCH: 29.9 pg (ref 26.0–34.0)
MCH: 30.5 pg (ref 26.0–34.0)
MCHC: 32.8 g/dL (ref 30.0–36.0)
MCHC: 33.2 g/dL (ref 30.0–36.0)
MCV: 91.3 fL (ref 78.0–100.0)
MCV: 91.7 fL (ref 78.0–100.0)
Platelets: 142 10*3/uL — ABNORMAL LOW (ref 150–400)
Platelets: 130 10*3/uL — ABNORMAL LOW (ref 150–400)
RBC: 3.44 MIL/uL — ABNORMAL LOW (ref 3.87–5.11)
RBC: 3.02 MIL/uL — ABNORMAL LOW (ref 3.87–5.11)
RDW: 13.6 % (ref 11.5–15.5)
RDW: 13.7 % (ref 11.5–15.5)
WBC: 18.4 10*3/uL — ABNORMAL HIGH (ref 4.0–10.5)
WBC: 15.3 10*3/uL — ABNORMAL HIGH (ref 4.0–10.5)

## 2014-04-15 LAB — COMPREHENSIVE METABOLIC PANEL
ALBUMIN: 2.2 g/dL — AB (ref 3.5–5.2)
ALT: 15 U/L (ref 0–35)
AST: 26 U/L (ref 0–37)
Alkaline Phosphatase: 129 U/L — ABNORMAL HIGH (ref 39–117)
BUN: 6 mg/dL (ref 6–23)
CALCIUM: 7.4 mg/dL — AB (ref 8.4–10.5)
CO2: 24 mEq/L (ref 19–32)
Chloride: 99 mEq/L (ref 96–112)
Creatinine, Ser: 0.71 mg/dL (ref 0.50–1.10)
GFR calc non Af Amer: >90 mL/min (ref >90)
GLUCOSE: 150 mg/dL — AB (ref 70–99)
Potassium: 4.5 mEq/L (ref 3.7–5.3)
Sodium: 134 mEq/L — ABNORMAL LOW (ref 137–147)
TOTAL PROTEIN: 5.3 g/dL — AB (ref 6.0–8.3)
Total Bilirubin: 0.2 mg/dL — ABNORMAL LOW (ref 0.3–1.2)

## 2014-04-15 LAB — HIV ANTIBODY (ROUTINE TESTING W REFLEX): HIV: NONREACTIVE

## 2014-04-15 LAB — ABO/RH: ABO/RH(D): A POS

## 2014-04-15 LAB — MRSA PCR SCREENING: MRSA by PCR: NEGATIVE

## 2014-04-15 LAB — URIC ACID: URIC ACID, SERUM: 5 mg/dL (ref 2.4–7.0)

## 2014-04-15 MED ORDER — DIPHENHYDRAMINE HCL 25 MG PO CAPS: 25.0000 mg | ORAL_CAPSULE | Freq: Four times a day (QID) | ORAL | Status: DC | PRN

## 2014-04-15 MED ORDER — TETANUS-DIPHTH-ACELL PERTUSSIS 5-2.5-18.5 LF-MCG/0.5 IM SUSP
0.5000 mL | Freq: Once | INTRAMUSCULAR | Status: DC
  Filled 2014-04-15: qty 0.5

## 2014-04-15 MED ORDER — SIMETHICONE 80 MG PO CHEW: 80.0000 mg | CHEWABLE_TABLET | ORAL | Status: DC | PRN

## 2014-04-15 MED ORDER — IBUPROFEN 600 MG PO TABS
600.0000 mg | ORAL_TABLET | Freq: Four times a day (QID) | ORAL | Status: DC
  Administered 2014-04-15 – 2014-04-17 (×10): 600 mg via ORAL
  Filled 2014-04-15 (×10): qty 1

## 2014-04-15 MED ORDER — ONDANSETRON HCL 4 MG/2ML IJ SOLN: 4.0000 mg | INTRAMUSCULAR | Status: DC | PRN

## 2014-04-15 MED ORDER — SENNOSIDES-DOCUSATE SODIUM 8.6-50 MG PO TABS
2.0000 | ORAL_TABLET | ORAL | Status: DC
  Administered 2014-04-15 – 2014-04-17 (×2): 2 via ORAL
  Filled 2014-04-15 (×2): qty 2

## 2014-04-15 MED ORDER — OXYCODONE-ACETAMINOPHEN 5-325 MG PO TABS
1.0000 | ORAL_TABLET | ORAL | Status: DC | PRN
Start: 2014-04-15 — End: 2014-04-17

## 2014-04-15 MED ORDER — ONDANSETRON HCL 4 MG PO TABS: 4.0000 mg | ORAL_TABLET | ORAL | Status: DC | PRN

## 2014-04-15 MED ORDER — PRENATAL MULTIVITAMIN CH
1.0000 | ORAL_TABLET | Freq: Every day | ORAL | Status: DC
  Administered 2014-04-15 – 2014-04-17 (×3): 1 via ORAL
  Filled 2014-04-15 (×3): qty 1

## 2014-04-15 MED ORDER — ZOLPIDEM TARTRATE 5 MG PO TABS: 5.0000 mg | ORAL_TABLET | Freq: Every evening | ORAL | Status: DC | PRN

## 2014-04-15 MED ORDER — SODIUM CHLORIDE 0.9 % IJ SOLN
3.0000 mL | Freq: Two times a day (BID) | INTRAMUSCULAR | Status: DC
Start: 2014-04-15 — End: 2014-04-17
  Administered 2014-04-15: 3 mL via INTRAVENOUS

## 2014-04-15 MED ORDER — MAGNESIUM SULFATE 40 G IN LACTATED RINGERS - SIMPLE
2.0000 g/h | INTRAVENOUS | Status: DC
  Administered 2014-04-15: 2 g/h via INTRAVENOUS
  Filled 2014-04-15 (×2): qty 500

## 2014-04-15 MED ORDER — SODIUM CHLORIDE 0.9 % IV SOLN: 250.0000 mL | INTRAVENOUS | Status: DC | PRN

## 2014-04-15 MED ORDER — BISACODYL 10 MG RE SUPP: 10.0000 mg | Freq: Every day | RECTAL | Status: DC | PRN

## 2014-04-15 MED ORDER — DIBUCAINE 1 % RE OINT: 1.0000 "application " | TOPICAL_OINTMENT | RECTAL | Status: DC | PRN

## 2014-04-15 MED ORDER — FLEET ENEMA 7-19 GM/118ML RE ENEM: 1.0000 | ENEMA | Freq: Every day | RECTAL | Status: DC | PRN

## 2014-04-15 MED ORDER — LANOLIN HYDROUS EX OINT: TOPICAL_OINTMENT | CUTANEOUS | Status: DC | PRN

## 2014-04-15 MED ORDER — SODIUM CHLORIDE 0.9 % IJ SOLN: 3.0000 mL | INTRAMUSCULAR | Status: DC | PRN

## 2014-04-15 MED ORDER — WITCH HAZEL-GLYCERIN EX PADS: 1.0000 "application " | MEDICATED_PAD | CUTANEOUS | Status: DC | PRN

## 2014-04-15 MED ORDER — BENZOCAINE-MENTHOL 20-0.5 % EX AERO
1.0000 "application " | INHALATION_SPRAY | CUTANEOUS | Status: DC | PRN
  Filled 2014-04-15: qty 56

## 2014-04-15 NOTE — Lactation Note (Signed)
This note was copied from the chart of Alison Duran. Lactation Consultation Note     Follow up consult with this mom of an arly term baby, now 65 4/[redacted] weeks gestation and 12 hours old. Mom applied nipple shiled, with some help. Baby latched in football hold, easly. Deep latch with strong suckles and swallows. Latch score 6 due to previous nipple damage and tenderness, and flat nipples. Mom knows to call for questions/concerns. Basci teaching from baby and me book done with mom and dad.  Patient Name: Alison Duran [id number] Date: 04/15/2014 Reason for consult: Follow-up assessment   Maternal Data Formula Feeding for Exclusion: Yes (mom in [facility]) Reason for exclusion: Admission to Intensive Care Unit (ICU) post-partum Infant to breast within first hour of birth: Yes Has patient been taught Hand Expression?: Yes Does the patient have breastfeeding experience prior to this delivery?: No  Feeding Feeding Type: Breast Fed Length of feed: 10 min  LATCH Score/Interventions Latch: Grasps breast easily, tongue down, lips flanged, rhythmical sucking. (20 nipple shiled) Intervention(s): Adjust position;Assist with latch  Audible Swallowing: A few with stimulation Intervention(s): Skin to skin;Hand expression  Type of Nipple: Flat Intervention(s): Shells  Comfort (Breast/Nipple): Filling, red/small blisters or bruises, mild/mod discomfort  Problem noted: Cracked, bleeding, blisters, bruises;Mild/Moderate discomfort Interventions  (Cracked/bleeding/bruising/blister): Expressed breast milk to nipple Interventions (Mild/moderate discomfort): Hand expression  Hold (Positioning): Assistance needed to correctly position infant at breast and maintain latch. Intervention(s): Breastfeeding basics reviewed;Support Pillows;Position options;Skin to skin  LATCH Score: 6  Lactation Tools Discussed/Used Tools: Pump Nipple shield size: 20 Breast pump type: Manual (mom shown how ot use, to evert  nipple prio to latch, and start milk flowing ) WIC Program: No   Consult Status Consult Status: Follow-up Date: 04/16/14 Follow-up type: In-patient    Alfred Levins 04/15/2014, 2:14 PM

## 2014-04-15 NOTE — Lactation Note (Signed)
This note was copied from the chart of Alison Duran. Lactation Consultation Note    Initial consult with this first time mom and baby, now 10 hours old and 37 4/[redacted] weeks gestation. Baby has been breast feeding well, but in last feed has been pulling away and causing nipple stripe and scabs on both nipples. Mom has lots of easily expressed colostrum. Mom has edematous breasts, soft nipples that are flat at the moment, do evert, but no shaft, making it difficult for baby to maintain latch. I tried to latched baby in football hold. Mom liked the position, but baby was sleepy. I then tried a 20 nipple shield, and used EBM to entice baby to open, and she latched well, and with shield and breast compressed, I was able to advance to deep latch, no discomfort with mom now, deep visible swallows, slow, rhythmic suckles. Lots of colostrum seen in shield after feeding. Baby unlatched after 10 minutes of active sucking. Lactation services reviewed with mom. I showed mom how to apply shield, and told her to call for assistance with n ext feeding.   Patient Name: Alison Duran [id number] Date: 04/15/2014 Reason for consult: Initial assessment;Other (Comment) (early term, 37 4/7 )   Maternal Data Formula Feeding for Exclusion: Yes (mom in [facility]) Reason for exclusion: Admission to Intensive Care Unit (ICU) post-partum Infant to breast within first hour of birth: Yes Has patient been taught Hand Expression?: Yes Does the patient have breastfeeding experience prior to this delivery?: No  Feeding Feeding Type: Breast Fed Length of feed: 10 min  LATCH Score/Interventions Latch: Repeated attempts needed to sustain latch, nipple held in mouth throughout feeding, stimulation needed to elicit sucking reflex. (20 nipple shiled with deep latch) Intervention(s): Adjust position;Assist with latch;Breast massage;Breast compression  Audible Swallowing: A few with stimulation Intervention(s): Skin to skin;Hand  expression  Type of Nipple: Flat (edema and short shafted nipples, 20 nipple shiled with good latch, they do evert with stimulation) Intervention(s): Shells  Comfort (Breast/Nipple): Filling, red/small blisters or bruises, mild/mod discomfort  Problem noted: Cracked, bleeding, blisters, bruises;Mild/Moderate discomfort Interventions  (Cracked/bleeding/bruising/blister): Expressed breast milk to nipple Interventions (Mild/moderate discomfort): Hand expression  Hold (Positioning): Assistance needed to correctly position infant at breast and maintain latch. Intervention(s): Breastfeeding basics reviewed;Support Pillows;Position options;Skin to skin  LATCH Score: 5  Lactation Tools Discussed/Used Tools: Nipple Shields Nipple shield size: 20   Consult Status Consult Status: Follow-up Date: 04/16/14 Follow-up type: In-patient    Alfred Levins 04/15/2014, 11:34 AM

## 2014-04-15 NOTE — Anesthesia Postprocedure Evaluation (Addendum)
  Anesthesia Post-op Note  Anesthesia Post Note  Patient: Alison Duran  Procedure(s) Performed: * No procedures listed *  Anesthesia type: Epidural  Patient location: AICU  Post pain: Pain level controlled  Post assessment: Post-op Vital signs reviewed  Last Vitals:  Filed Vitals:   04/15/14 0700  BP: 116/68  Pulse: 98  Temp:   Resp:     Post vital signs: Reviewed  Level of consciousness:alert  Complications: No apparent anesthesia complications

## 2014-04-15 NOTE — Progress Notes (Signed)
Delivery Note At 1:19 AM a viable female was delivered via  (PresentationLOA: ;  ).  APGAR: 8,9 ; weight Pending .   Placenta status: intact,to pathology .  Cord:3 vessels  with the following complications: .  Cord pH: pending Blood banking cord blood also drawn at patient request.  Anesthesia: epidural   Episiotomy: none Lacerations: right vaginal second degree repaired, left 3cm stable hematoma at 5:00-a single stitch placed, observed and stable Suture Repair: 3.0 vicryl rapide Est. Blood Loss (mL): 300  Mom to postpartum.  Baby to Couplet care / Skin to Skin.  Roselle Locus II 04/15/2014, 1:46 AM

## 2014-04-15 NOTE — Progress Notes (Signed)
Post Partum Day 1 Subjective: no complaints and tolerating PO  Objective: Blood pressure 122/88, pulse 97, temperature 98.3 F (36.8 C), temperature source Oral, resp. rate 18, height 5\' 5"  (1.651 m), weight 66.225 kg (146 lb), last menstrual period 07/19/2013, SpO2 98.00%, unknown if currently breastfeeding.  Physical Exam:  General: alert, cooperative, appears stated age and no distress Lochia: appropriate Uterine Fundus: firm Incision: hematoma stable  DVT Evaluation: No evidence of DVT seen on physical exam.   Recent Labs  04/15/14 0335 04/15/14 0610  HGB 10.3* 9.2*  HCT 31.4* 27.7*    Assessment/Plan: PP pih stable Will d/c mag these evening   LOS: 1 day   Alison Duran 04/15/2014, 10:10 AM

## 2014-04-15 NOTE — Progress Notes (Signed)
Platelet ct = 142. Epidural removed per protocol and as per order if plt. ct >100, 000. Pt. Tolerated well, no c/o, catheter d/c'd with ease, catheter intact with blue tip visible

## 2014-04-16 NOTE — Discharge Instructions (Signed)
° °  Iron-Rich Diet ° °An iron-rich diet contains foods that are good sources of iron. Iron is an important mineral that helps your body produce hemoglobin. Hemoglobin is a protein in red blood cells that carries oxygen to the body's tissues. Sometimes, the iron level in your blood can be low. This may be caused by: °· A lack of iron in your diet. °· Blood loss. °· Times of growth, such as during pregnancy or during a child's growth and development. °Low levels of iron can cause a decrease in the number of red blood cells. This can result in iron deficiency anemia. Iron deficiency anemia symptoms include: °· Tiredness. °· Weakness. °· Irritability. °· Increased chance of infection. °Here are some recommendations for daily iron intake: °· Males older than 23 years of age need 8 mg of iron per day. °· Women ages 19 to 50 need 18 mg of iron per day. °· Pregnant women need 27 mg of iron per day, and women who are over 19 years of age and breastfeeding need 9 mg of iron per day. °· Women over the age of 50 need 8 mg of iron per day. °SOURCES OF IRON °There are 2 types of iron that are found in food: heme iron and nonheme iron. Heme iron is absorbed by the body better than nonheme iron. Heme iron is found in meat, poultry, and fish. Nonheme iron is found in grains, beans, and vegetables. °Heme Iron Sources °Food / Iron (mg) °· Chicken liver, 3 oz (85 g)/ 10 mg °· Beef liver, 3 oz (85 g)/ 5.5 mg °· Oysters, 3 oz (85 g)/ 8 mg °· Beef, 3 oz (85 g)/ 2 to 3 mg °· Shrimp, 3 oz (85 g)/ 2.8 mg °· Turkey, 3 oz (85 g)/ 2 mg °· Chicken, 3 oz (85 g) / 1 mg °· Fish (tuna, halibut), 3 oz (85 g)/ 1 mg °· Pork, 3 oz (85 g)/ 0.9 mg °Nonheme Iron Sources °Food / Iron (mg) °· Ready-to-eat breakfast cereal, iron-fortified / 3.9 to 7 mg °· Tofu, ½ cup / 3.4 mg °· Kidney beans, ½ cup / 2.6 mg °· Baked potato with skin / 2.7 mg °· Asparagus, ½ cup / 2.2 mg °· Avocado / 2 mg °· Dried peaches, ½ cup / 1.6 mg °· Raisins, ½ cup / 1.5 mg °· Soy milk,  1 cup / 1.5 mg °· Whole-wheat bread, 1 slice / 1.2 mg °· Spinach, 1 cup / 0.8 mg °· Broccoli, ½ cup / 0.6 mg °IRON ABSORPTION °Certain foods can decrease the body's absorption of iron. Try to avoid these foods and beverages while eating meals with iron-containing foods: °· Coffee. °· Tea. °· Fiber. °· Soy. °Foods containing vitamin C can help increase the amount of iron your body absorbs from iron sources, especially from nonheme sources. Eat foods with vitamin C along with iron-containing foods to increase your iron absorption. Foods that are high in vitamin C include many fruits and vegetables. Some good sources are: °· Fresh orange juice. °· Oranges. °· Strawberries. °· Mangoes. °· Grapefruit. °· Red bell peppers. °· Green bell peppers. °· Broccoli. °· Potatoes with skin. °· Tomato juice. °Document Released: 06/11/2005 Document Revised: 01/20/2012 Document Reviewed: 04/18/2011 °Exit[employer]® Patient Information ©2014 [employer]. ° °

## 2014-04-16 NOTE — Progress Notes (Signed)
Patient set up with a DEBP.

## 2014-04-16 NOTE — Progress Notes (Signed)
Post Partum Day 1 Subjective: no complaints, up ad lib, voiding, tolerating PO and + flatus  Objective: Blood pressure 120/72, pulse 85, temperature 98.2 F (36.8 C), temperature source Oral, resp. rate 18, height 5\' 5"  (1.651 m), weight 66.225 kg (146 lb), last menstrual period 07/19/2013, SpO2 98.00%, unknown if currently breastfeeding.  Physical Exam:  General: alert, cooperative, appears stated age and no distress Lochia: appropriate Uterine Fundus: firm Incision: healing well DVT Evaluation: No evidence of DVT seen on physical exam.   Recent Labs  04/15/14 0335 04/15/14 0610  HGB 10.3* 9.2*  HCT 31.4* 27.7*    Assessment/Plan: Plan for discharge tomorrow and Breastfeeding   LOS: 2 days   [name] 04/16/2014, 9:22 AM

## 2014-04-17 NOTE — Discharge Summary (Signed)
Obstetric Discharge Summary Reason for Admission: induction of labor Prenatal Procedures: none Intrapartum Procedures: spontaneous vaginal delivery Postpartum Procedures: none Complications-Operative and Postpartum: none Hemoglobin  Date Value Ref Range Status  04/15/2014 9.2* 12.0 - 15.0 g/dL Final     HCT  Date Value Ref Range Status  04/15/2014 27.7* 36.0 - 46.0 % Final    Physical Exam:  General: alert, cooperative, appears stated age and no distress Lochia: appropriate Uterine Fundus: firm Incision: healing well DVT Evaluation: No evidence of DVT seen on physical exam.  Discharge Diagnoses: Term Pregnancy-delivered  Discharge Information: Date: 04/17/2014 Activity: pelvic rest Diet: routine Medications: None Condition: stable Instructions: refer to practice specific booklet Discharge to: home   Newborn Data: Live born female  Birth Weight: 6 lb 2.2 oz (2785 g) APGAR: 8, 9  Home with mother.  [name] 04/17/2014, 9:26 AM

## 2014-04-20 ENCOUNTER — Ambulatory Visit (HOSPITAL_COMMUNITY)
Admission: RE | Admit: 2014-04-20 | Discharge: 2014-04-20 | Disposition: A | Discharge status: Home / Self Care | Payer: BC Managed Care – PPO | Source: Ambulatory Visit | Attending: Obstetrics and Gynecology | Admitting: Obstetrics and Gynecology

## 2014-04-20 NOTE — Lactation Note (Signed)
Adult Lactation Consultation Outpatient Visit Note; Mom here today asking about using a larger NS.Drop in visit. Mom has been using #20 NS. Wants to try larger one. Reports that baby has been feeding well and mature milk is in. Baby gaining weight. On their way to Ped visit for repeat bili check. # 24 NS too large for mom. Encouraged to try nursing without NS. Mom has had sore nipples- scabs noted on tips of both nipples. Has Nuk single pump- has pumped a few times for comfort since milk has come in. Waiting on pump from insurance company. No questions at present. To call prn  Patient Name: Alison Duran Date of Birth: 24-Sep-1991 Gestational Age at Delivery: Unknown Type of Delivery:   Breastfeeding History: Frequency of Breastfeeding:  Length of Feeding:  Voids:  Stools:   Supplementing / Method: Pumping:  Type of Pump:   Frequency:  Volume:    Comments:    Consultation Evaluation:  Initial Feeding Assessment: Pre-feed Weight: Post-feed Weight: Amount Transferred: Comments:  Additional Feeding Assessment: Pre-feed Weight: Post-feed Weight: Amount Transferred: Comments:  Additional Feeding Assessment: Pre-feed Weight: Post-feed Weight: Amount Transferred: Comments:  Total Breast milk Transferred this Visit:  Total Supplement Given:   Additional Interventions:   Follow-Up  With Ped To call here prn    Pamelia Hoit 04/20/2014, 9:54 AM

## 2014-09-12 ENCOUNTER — Encounter (HOSPITAL_COMMUNITY): Payer: Self-pay | Admitting: *Deleted

## 2015-06-30 IMAGING — US US OB DETAIL+14 WK
1 series · 12 of 28 positions shown · non-contrast
Comparison: none

[Series 1: us ob detail+14 wk · 0.19mm/px · 12 of 85 slices shown]
[im 4/85]
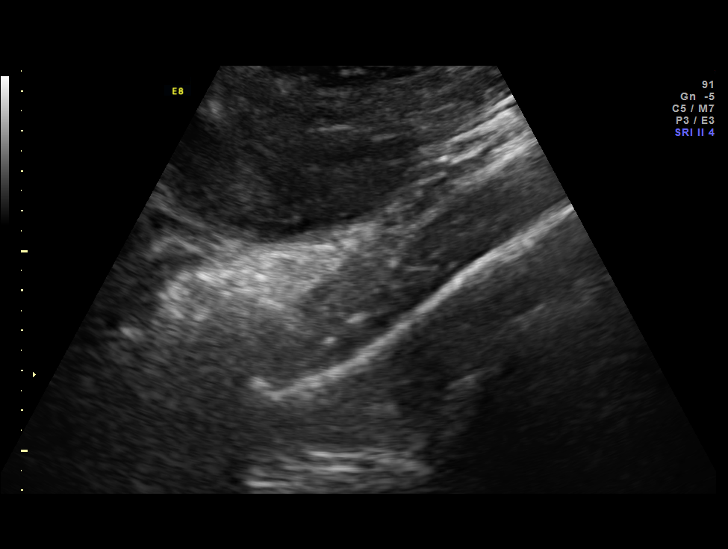
[im 10/85]
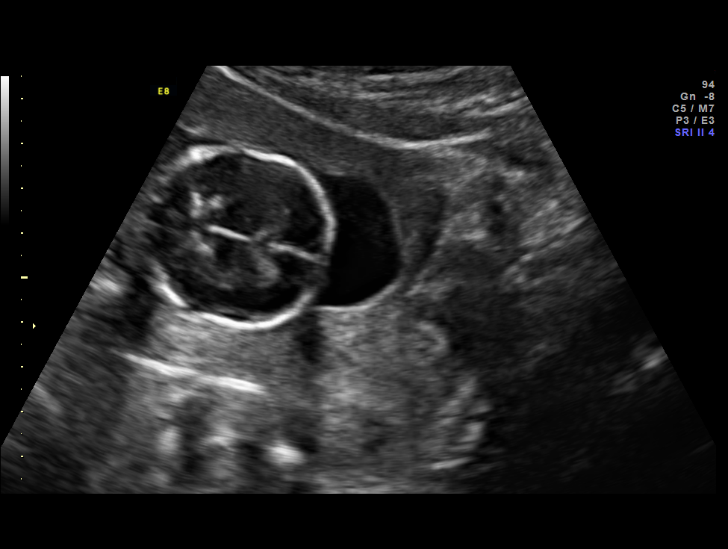
[im 16/85]
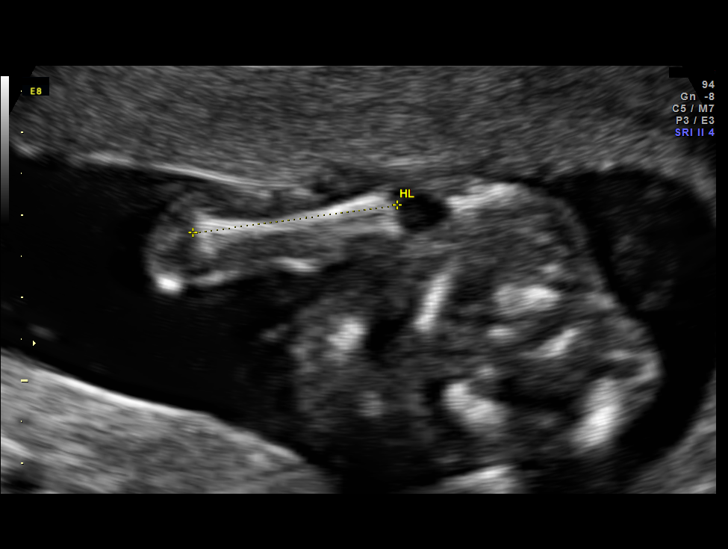
[im 25/85]
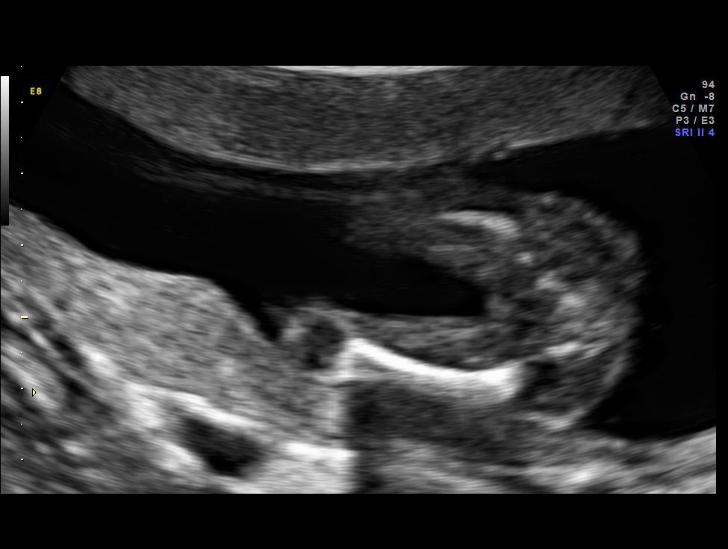
[im 32/85]
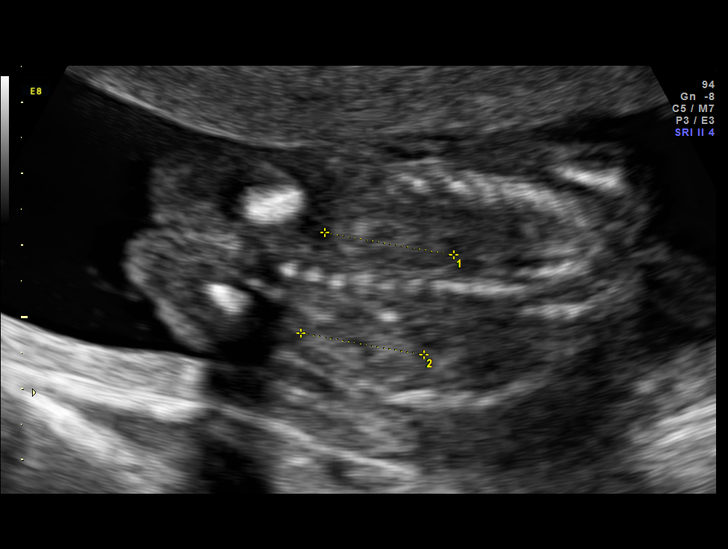
[im 38/85]
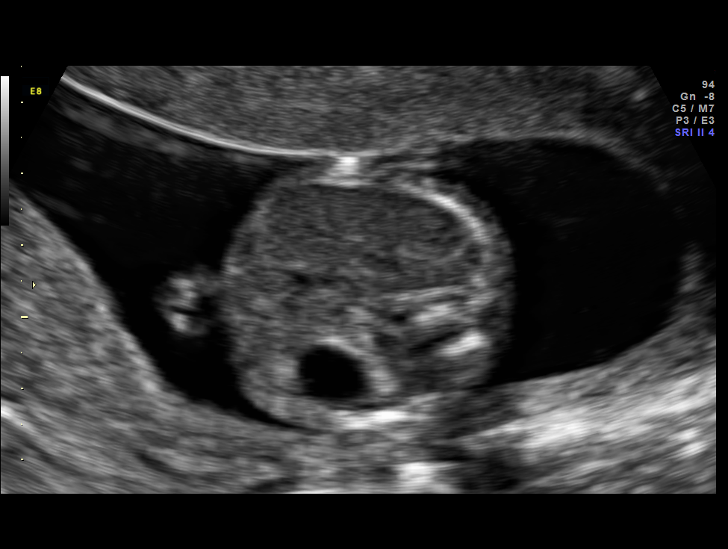
[im 47/85]
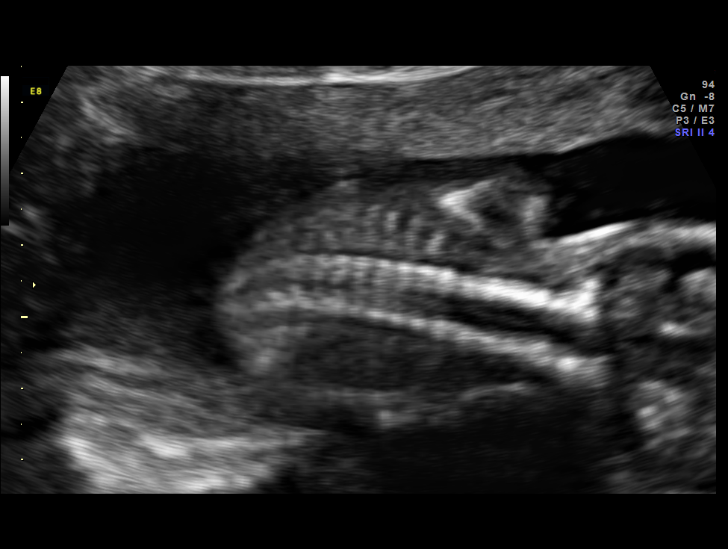
[im 53/85]
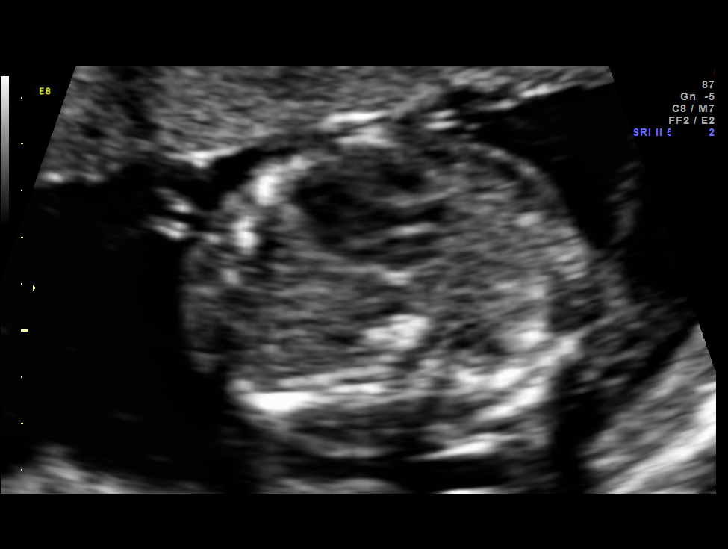
[im 60/85]
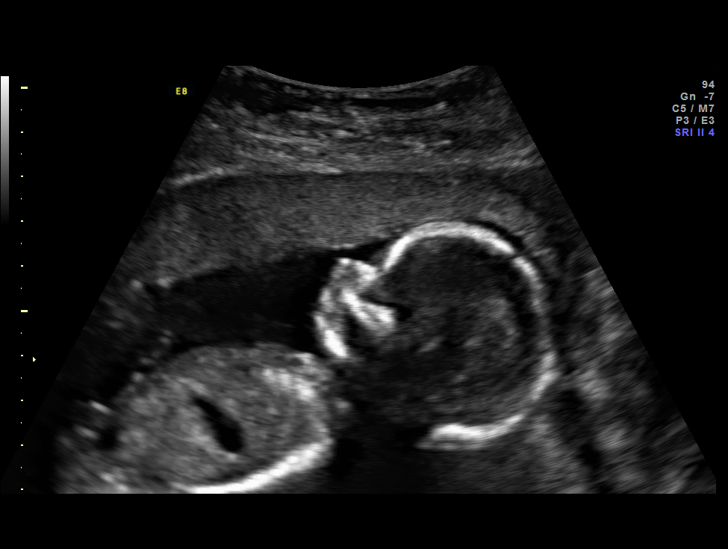
[im 69/85]
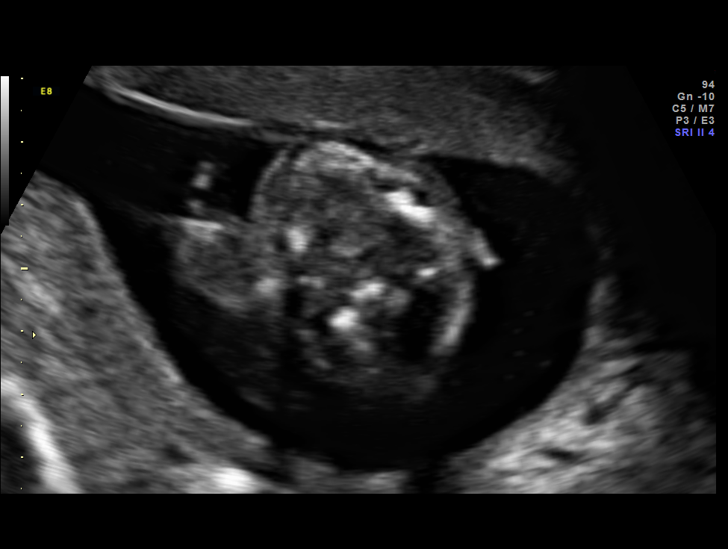
[im 75/85]
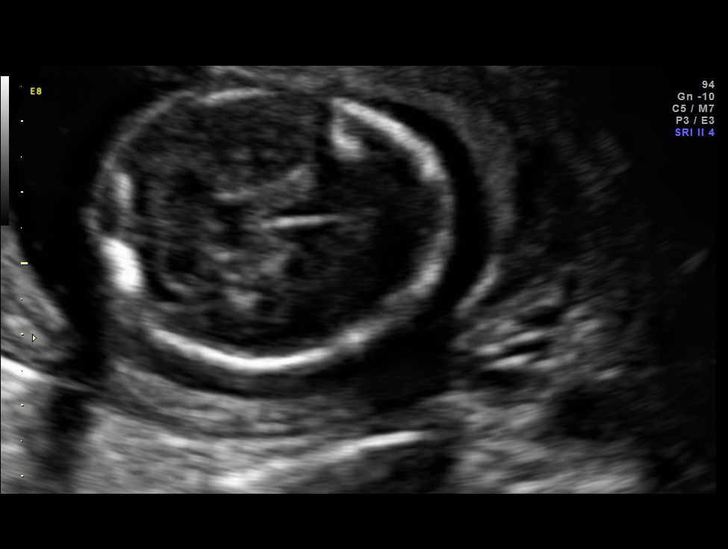
[im 81/85]
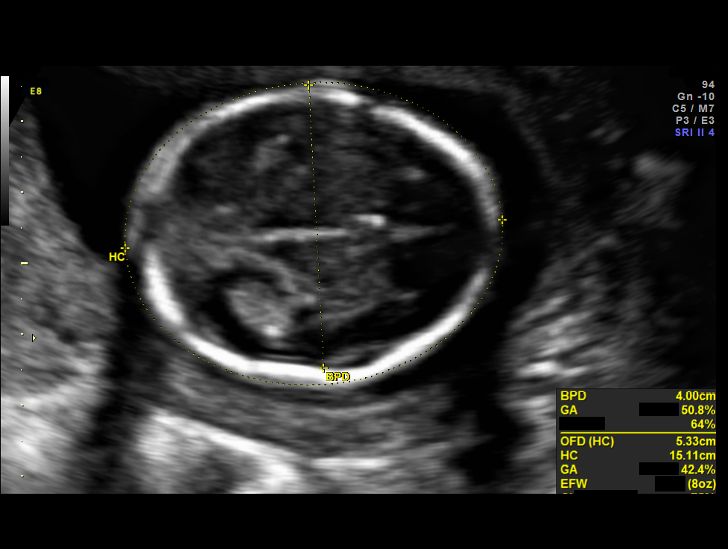

[12 of 28 positions shown; findings below may reference images not displayed]

OBSTETRICS REPORT
                      (Signed Final 11/30/2013 [DATE])

Service(s) Provided

 US OB DETAIL + 14 WK                                  76811.0
Indications

 Detailed fetal anatomic survey
 History of genetic / anatomic abnormality -
 lissencephaly
Fetal Evaluation

 Num Of Fetuses:    1
 Fetal Heart Rate:  165                          bpm
 Cardiac Activity:  Observed
 Presentation:      Cephalic
 Placenta:          Anterior, above cervical os
 P. Cord            Visualized
 Insertion:

 Amniotic Fluid
 AFI FV:      Subjectively within normal limits
                                             Larg Pckt:     3.9  cm
Biometry

 BPD:     40.3  mm     G. Age:  18w 1d                CI:         75.0   70 - 86
 OFD:     53.7  mm                                    FL/HC:      16.8   15.8 -
                                                                         18
 HC:       151  mm     G. Age:  18w 1d       43  %    HC/AC:      1.16   1.07 -

 AC:     129.7  mm     G. Age:  18w 4d       60  %    FL/BPD:
 FL:      25.3  mm     G. Age:  17w 5d       27  %    FL/AC:      19.5   20 - 24
 HUM:       25  mm     G. Age:  17w 6d       45  %
 CER:     19.3  mm     G. Age:  18w 5d       66  %
 NFT:      4.2  mm

 Est. FW:     225  gm      0 lb 8 oz     48  %
Gestational Age

 LMP:           19w 1d        Date:  07/19/13                 EDD:   04/25/14
 U/S Today:     18w 1d                                        EDD:   05/02/14
 Best:          18w 1d     Det. By:  Early Ultrasound         EDD:   05/02/14
                                     (09/17/13)
Anatomy

 Cranium:          Appears normal         Aortic Arch:      Appears normal
 Fetal Cavum:      Appears normal         Ductal Arch:      Appears normal
 Ventricles:       Appears normal         Diaphragm:        Appears normal
 Choroid Plexus:   Appears normal         Stomach:          Appears normal
 Cerebellum:       Appears normal         Abdomen:          Appears normal
 Posterior Fossa:  Appears normal         Abdominal Wall:   Appears nml (cord
                                                            insert, abd wall)
 Nuchal Fold:      Appears normal         Cord Vessels:     Appears normal (3
                                                            vessel cord)
 Face:             Appears normal         Kidneys:          Appear normal
                   (orbits and profile)
 Lips:             Appears normal         Bladder:          Appears normal
 Palate:           Appears normal         Spine:            Appears normal
 Heart:            Appears normal         Lower             Appears normal
                   (4CH, axis, and        Extremities:
                   situs)
 RVOT:             Appears normal         Upper             Appears normal
                                          Extremities:
 LVOT:             Appears normal

 Other:  Fetus appears to be a female. Heels and 5th digit appear normal.
Targeted Anatomy

 Fetal Central Nervous System
 Cisterna Magna:
Cervix Uterus Adnexa

 Cervical Length:    3        cm

 Cervix:       Normal appearance by transabdominal scan. Appears
               closed, without funnelling.

 Left Ovary:    Not visualized. No adnexal mass visualized.
 Right Ovary:   Not visualized. No adnexal mass visualized.
Comments

 The patient's fetal anatomic survey is now complete.  No fetal
 anomalies or soft markers of aneuploidy were seen.  Results
 of scan discussed with patient, as well as the limitations of
 U/S to detect all fetal anomalies and aneuploidies.  Ms.
 Asger Belek had lissencephaly, which usually
 not identifiable at this early gestational age, as the majority of
 cerebral gyration has not yet occured.  Thus, I recommend a
 repeat ultrasound be performed at 32 weeks to reassess
 intracranial anatomy and hopefully rule out lissenecphaly.
Impression

 Single living intrauterine pregnancy at 18 weeks 1 day.
 Appropriate fetal growth (48%).
 Normal amniotic fluid volume.
 Normal fetal anatomy.
 No fetal anomalies or soft markers of aneuploidy seen.
Recommendations

 Recommend follow-up ultrasound examination at 32 weeks
 (see comments).

 questions or concerns.
                Giljanovic, Akmadzic

## 2015-10-13 IMAGING — US US OB FOLLOW-UP
1 series · 12 of 28 positions shown · non-contrast
Comparison: none

[Series 1: us ob follow-up · 0.12mm/px · 12 of 38 slices shown]
[im 2/38]
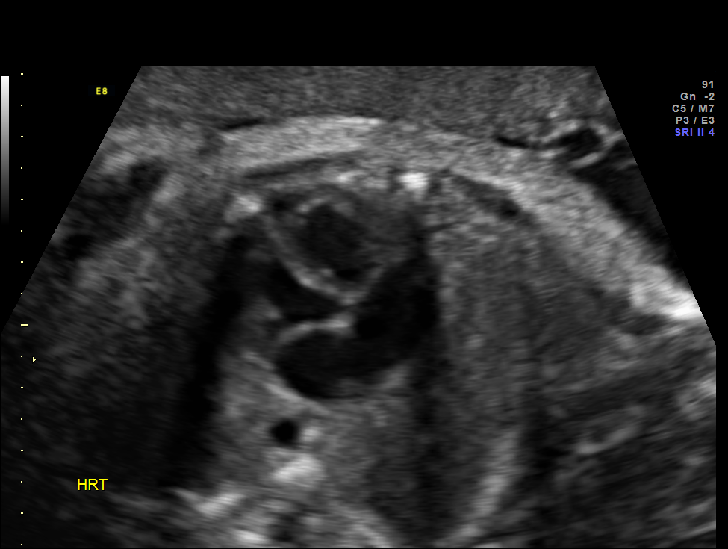
[im 5/38]
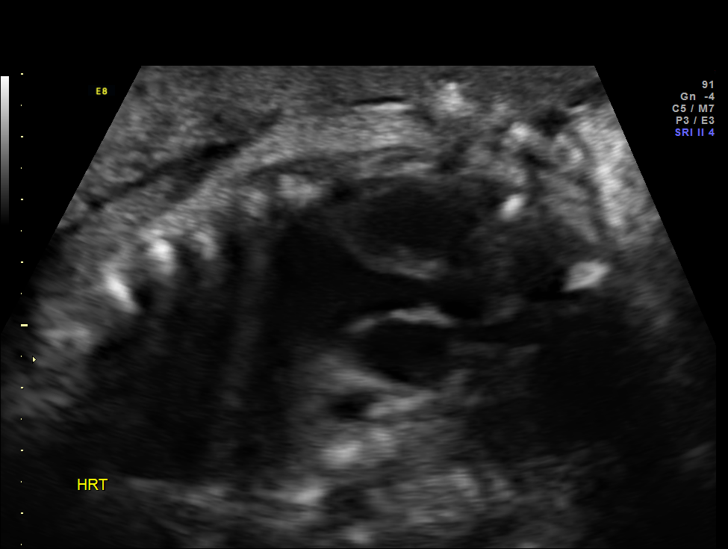
[im 7/38]
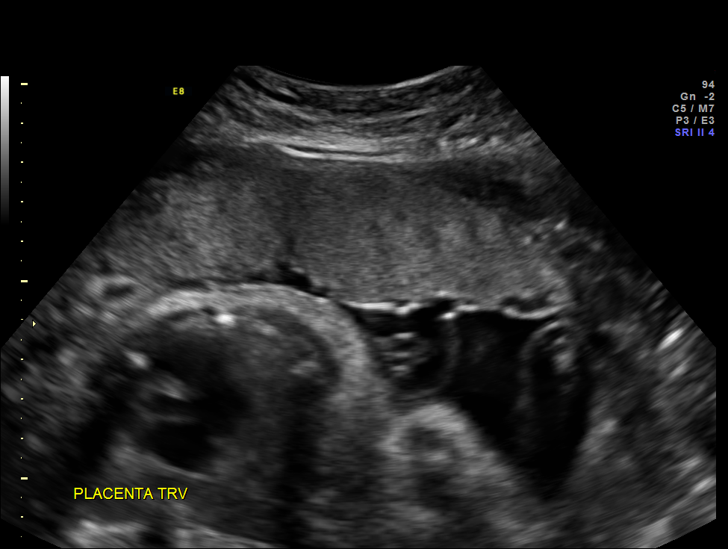
[im 11/38]
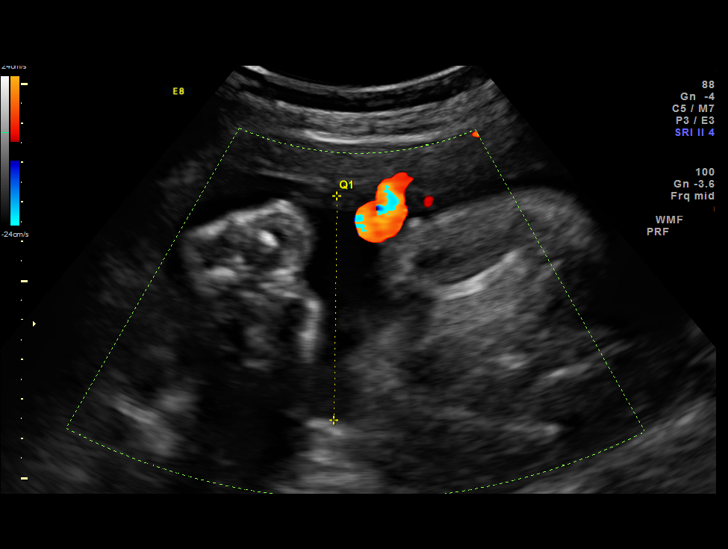
[im 14/38]
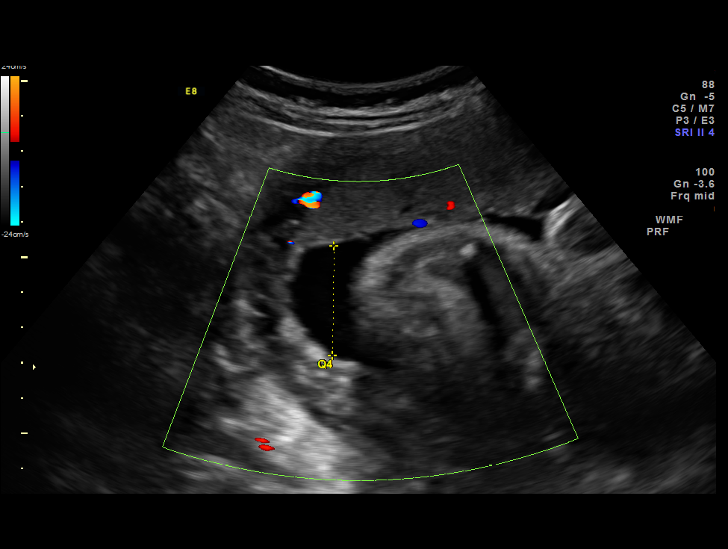
[im 17/38]
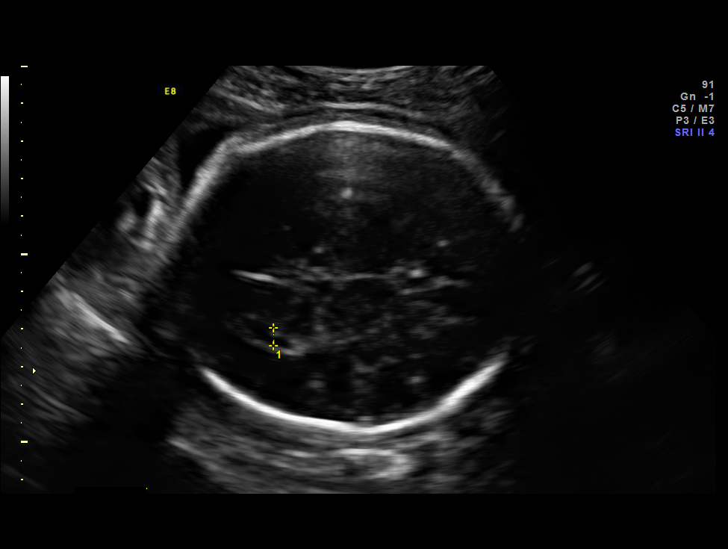
[im 21/38]
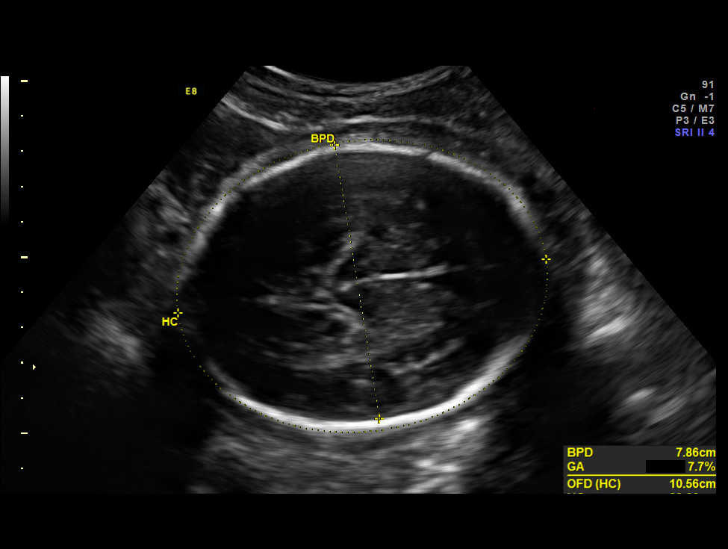
[im 24/38]
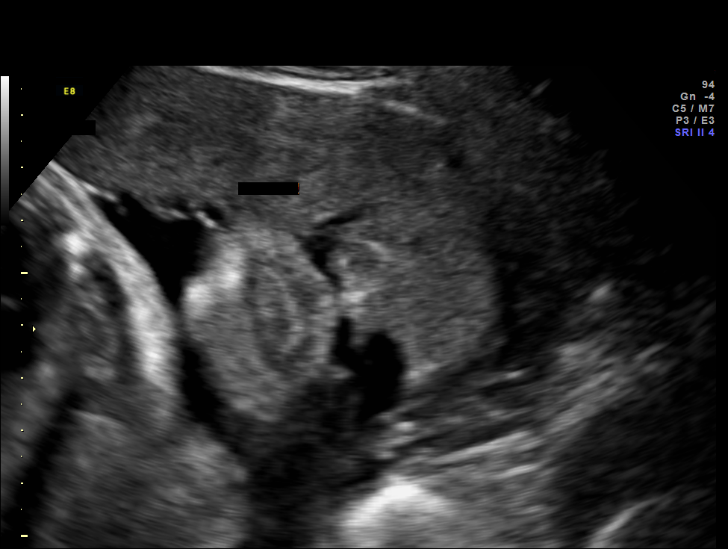
[im 27/38]
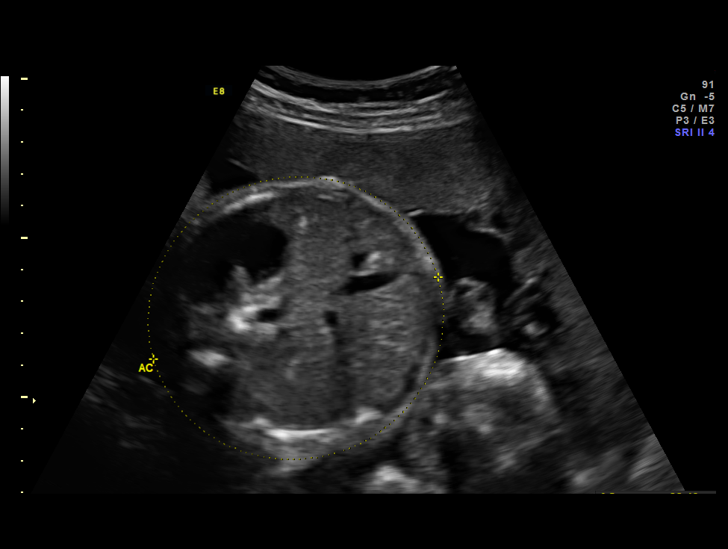
[im 31/38]
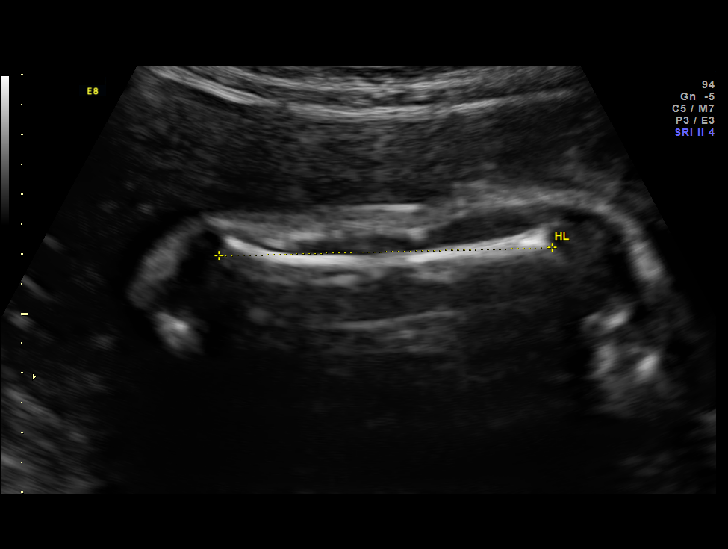
[im 33/38]
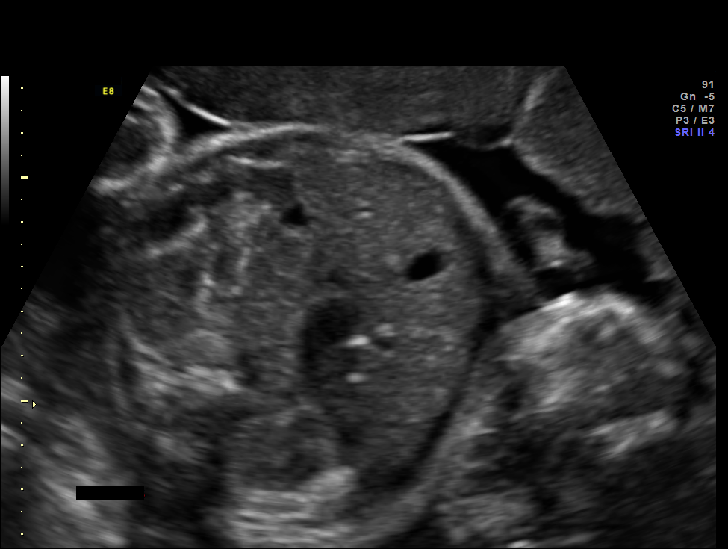
[im 36/38]
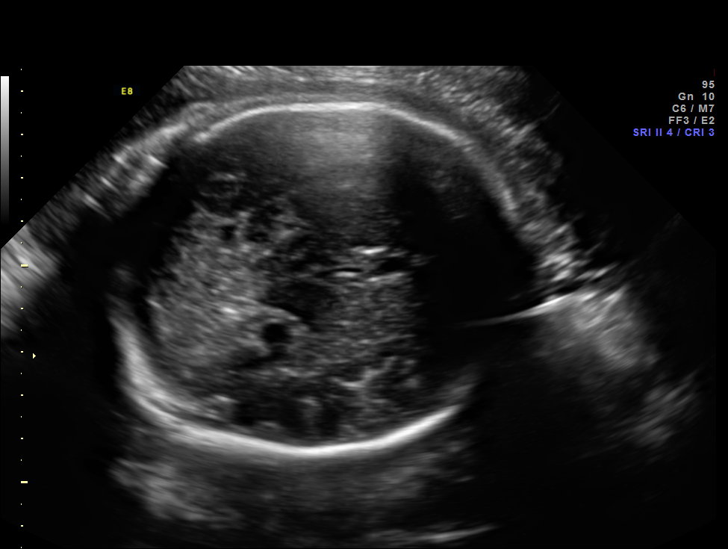

[12 of 28 positions shown; findings below may reference images not displayed]

OBSTETRICS REPORT
                      (Signed Final 03/15/2014 [DATE])

Service(s) Provided

 US OB FOLLOW UP                                       76816.1
Indications

 History of genetic / anatomic abnormality -
 lissencephaly (cousin of FOB)
Fetal Evaluation

 Num Of Fetuses:    1
 Fetal Heart Rate:  147                          bpm
 Cardiac Activity:  Observed
 Presentation:      Cephalic
 Placenta:          Anterior, above cervical os
 P. Cord            Previously Visualized
 Insertion:

 Amniotic Fluid
 AFI FV:      Subjectively within normal limits
 AFI Sum:     16.44   cm       59  %Tile     Larg Pckt:    5.69  cm
 RUQ:   5.69    cm   RLQ:    3.11   cm    LUQ:   4.21    cm   LLQ:    3.43   cm
Biometry

 BPD:     79.4  mm     G. Age:  31w 6d                CI:         75.4   70 - 86
 OFD:    105.3  mm                                    FL/HC:      22.0   19.9 -

 HC:     295.3  mm     G. Age:  32w 4d        9  %    HC/AC:      1.01   0.96 -

 AC:       291  mm     G. Age:  33w 1d       50  %    FL/BPD:     82.0   71 - 87
 FL:      65.1  mm     G. Age:  33w 4d       50  %    FL/AC:      22.4   20 - 24
 HUM:     55.9  mm     G. Age:  32w 4d       46  %
 CER:     42.6  mm     G. Age:  36w 4d       89  %

 Est. FW:    2335  gm    4 lb 11 oz      57  %
Gestational Age

 LMP:           34w 1d        Date:  07/19/13                 EDD:   04/25/14
 U/S Today:     32w 5d                                        EDD:   05/05/14
 Best:          33w 1d     Det. By:  Early Ultrasound         EDD:   05/02/14
                                     (09/17/13)
Anatomy
 Cranium:          Appears normal         Aortic Arch:      Previously seen
 Fetal Cavum:      Appears normal         Ductal Arch:      Previously seen
 Ventricles:       Appears normal         Diaphragm:        Previously seen
 Choroid Plexus:   Previously seen        Stomach:          Appears normal
 Cerebellum:       Appears normal         Abdomen:          Appears normal
 Posterior Fossa:  Appears normal         Abdominal Wall:   Previously seen
 Nuchal Fold:      Previously seen        Cord Vessels:     Previously seen
 Face:             Orbits and profile     Kidneys:          Appear normal
                   previously seen
 Lips:             Previously seen        Bladder:          Appears normal
 Palate:           Previously seen        Spine:            Previously seen
 Heart:            Appears normal         Lower             Previously seen
                   (4CH, axis, and        Extremities:
                   situs)
 RVOT:             Appears normal         Upper             Previously seen
                                          Extremities:
 LVOT:             Appears normal

 Other:  Female gender previously seen. Heels and 5th digit previously seen.
Targeted Anatomy

 Fetal Central Nervous System
 Cisterna Magna:
Cervix Uterus Adnexa

 Cervix:       Not visualized (advanced GA >73wks)
Impression

 SIUP at 33+1 weeks
 Normal interval anatomy; anatomic survey complete; no brain
 abnormalities identified
 Normal amniotic fluid volume
 Appropriate interval growth with EFW at the 57th %tile
Recommendations

 Follow-up as clinically indicated

 questions or concerns.

## 2015-11-12 NOTE — L&D Delivery Note (Signed)
Delivery Note Was called for delivery when patient was Ant lip and SROM clear fluid.  Arrived in 5 minutes (1 minute before delivery) to Faculty Physician in attendance of precipitous SVD viable female Apgars 9,9 over intact perineum. I was clamped the cord and completed the delivery of the placenta delivered spontaneously intact with 3VC. Good support and hemostasis noted. Mother and baby were doing well.  EBL 100cc  Candice Camp, MD

## 2015-11-17 LAB — OB RESULTS CONSOLE HEPATITIS B SURFACE ANTIGEN: HEP B S AG: NEGATIVE

## 2015-11-17 LAB — OB RESULTS CONSOLE GC/CHLAMYDIA
Chlamydia: NEGATIVE
Gonorrhea: NEGATIVE

## 2015-11-17 LAB — OB RESULTS CONSOLE HIV ANTIBODY (ROUTINE TESTING): HIV: NONREACTIVE

## 2015-11-17 LAB — OB RESULTS CONSOLE ABO/RH: RH TYPE: POSITIVE

## 2015-11-17 LAB — OB RESULTS CONSOLE RUBELLA ANTIBODY, IGM: RUBELLA: IMMUNE

## 2015-11-17 LAB — OB RESULTS CONSOLE RPR: RPR: NONREACTIVE

## 2015-11-17 LAB — OB RESULTS CONSOLE ANTIBODY SCREEN: Antibody Screen: NEGATIVE

## 2016-04-18 ENCOUNTER — Inpatient Hospital Stay (HOSPITAL_COMMUNITY)
Admission: AD | Admit: 2016-04-18 | Discharge: 2016-04-18 | Disposition: A | Discharge status: Home / Self Care | Payer: BLUE CROSS/BLUE SHIELD | Source: Ambulatory Visit | Attending: Obstetrics and Gynecology | Admitting: Obstetrics and Gynecology

## 2016-04-18 ENCOUNTER — Encounter (HOSPITAL_COMMUNITY): Payer: Self-pay | Admitting: *Deleted

## 2016-04-18 DIAGNOSIS — O23593 Infection of other part of genital tract in pregnancy, third trimester: Secondary | ICD-10-CM | POA: Diagnosis not present

## 2016-04-18 DIAGNOSIS — O9989 Other specified diseases and conditions complicating pregnancy, childbirth and the puerperium: Secondary | ICD-10-CM | POA: Diagnosis not present

## 2016-04-18 DIAGNOSIS — K219 Gastro-esophageal reflux disease without esophagitis: Secondary | ICD-10-CM | POA: Insufficient documentation

## 2016-04-18 DIAGNOSIS — O98813 Other maternal infectious and parasitic diseases complicating pregnancy, third trimester: Secondary | ICD-10-CM

## 2016-04-18 DIAGNOSIS — B373 Candidiasis of vulva and vagina: Secondary | ICD-10-CM | POA: Insufficient documentation

## 2016-04-18 DIAGNOSIS — O26893 Other specified pregnancy related conditions, third trimester: Secondary | ICD-10-CM | POA: Insufficient documentation

## 2016-04-18 DIAGNOSIS — E041 Nontoxic single thyroid nodule: Secondary | ICD-10-CM | POA: Insufficient documentation

## 2016-04-18 DIAGNOSIS — N898 Other specified noninflammatory disorders of vagina: Secondary | ICD-10-CM | POA: Insufficient documentation

## 2016-04-18 DIAGNOSIS — B3731 Acute candidiasis of vulva and vagina: Secondary | ICD-10-CM

## 2016-04-18 DIAGNOSIS — O99013 Anemia complicating pregnancy, third trimester: Secondary | ICD-10-CM | POA: Diagnosis not present

## 2016-04-18 DIAGNOSIS — Z3A31 31 weeks gestation of pregnancy: Secondary | ICD-10-CM | POA: Diagnosis not present

## 2016-04-18 DIAGNOSIS — Z3689 Encounter for other specified antenatal screening: Secondary | ICD-10-CM

## 2016-04-18 LAB — AMNISURE RUPTURE OF MEMBRANE (ROM) NOT AT ARMC: AMNISURE: NEGATIVE

## 2016-04-18 LAB — URINALYSIS, ROUTINE W REFLEX MICROSCOPIC
Bilirubin Urine: NEGATIVE
Glucose, UA: NEGATIVE mg/dL
Hgb urine dipstick: NEGATIVE
Ketones, ur: NEGATIVE mg/dL
Nitrite: NEGATIVE
PH: 5.5 (ref 5.0–8.0)
PROTEIN: NEGATIVE mg/dL
Specific Gravity, Urine: 1.01 (ref 1.005–1.030)

## 2016-04-18 LAB — URINE MICROSCOPIC-ADD ON: RBC / HPF: NONE SEEN RBC/hpf (ref 0–5)

## 2016-04-18 NOTE — MAU Note (Signed)
Pt reports she woke up with a wet spot in her bed. Has had yeast off and on. Wrnt to MD and + nitrozine and lower fluid on AFI. Told to come to MAU to have amnio sure done. Denies pain or cramping and good fetal movement reported.

## 2016-04-18 NOTE — MAU Provider Note (Signed)
History     CSN: [id number]  Arrival date and time: 04/18/16 1222   First Provider Initiated Contact with Patient 04/18/16 1337      Chief Complaint  Patient presents with  . Vaginal Discharge   HPI Comments: G2P1001 @31 .2 weeks sent from office for r/o ROM. Reports large spot of odorless fluid on sheets early this am. Denies VB or ctx. No LOF since. Good FM. Seen in office this morning and evaluation revealed +nitrizine, and ?low AF, sent to MAU for amnisure. Pregnancy complicated by anemia on po supplementation and hx of Pre-e with G1. She reports recurrent yeast vaginitis during this pregnancy and increased vaginal discharge with external vulvar irritation. She denies itching today but had previously.    OB History    Gravida Para Term Preterm AB TAB SAB Ectopic Multiple Living   2 1 1  0 0 0 0 0 0 1      Past Medical History  Diagnosis Date  . Multiple thyroid nodules     Has to have them checked once a year  . GERD (gastroesophageal reflux disease)     with pregnancy only; takes no meds  . Anemia     takes Iron twice daily    Past Surgical History  Procedure Laterality Date  . Shoulder surgery Right   . Wisdom tooth extraction      Family History  Problem Relation Age of Onset  . Hypertension Father     Social History  Substance Use Topics  . Smoking status: Never Smoker   . Smokeless tobacco: Never Used  . Alcohol Use: No    Allergies:  Allergies  Allergen Reactions  . Codeine Nausea And Vomiting  . Erythromycin Nausea And Vomiting  . Food Other (See Comments)    Potatoes and sesame seeds, pt preference  . Shellfish Allergy Other (See Comments)    Pt preference    Prescriptions prior to admission  Medication Sig Dispense Refill Last Dose  . ferrous sulfate 325 (65 FE) MG tablet Take 325 mg by mouth 2 (two) times daily with a meal.   04/14/2014 at Unknown time  . Prenatal Vit-Fe Fumarate-FA (PRENATAL MULTIVITAMIN) TABS tablet Take 1 tablet by mouth  daily at 12 noon.    04/13/2014 at Unknown time    Review of Systems  Constitutional: Negative.   HENT: Negative.   Genitourinary: Negative.   Neurological: Negative.    Physical Exam   Blood pressure 123/64, pulse 91, temperature 98.1 F (36.7 C), temperature source Oral, resp. rate 18, height 5\' 6"  (1.676 m), weight 146 lb 12.8 oz (66.588 kg), unknown if currently breastfeeding.  Physical Exam  Constitutional: She is oriented to person, place, and time. She appears well-developed and well-nourished.  HENT:  Head: Normocephalic and atraumatic.  Neck: Normal range of motion.  Cardiovascular: Normal rate.   Respiratory: Effort normal.  GI: Soft. There is no tenderness. There is no rebound.  gravid  Genitourinary:  External: mild erythema inferior to introitus Speculum: normal parous os, thick, clumpy, adherent, yellow discharge, neg pool SVE: closed/th/out of pelvis Amnisure collected  Musculoskeletal: Normal range of motion.  Neurological: She is alert and oriented to person, place, and time.  Skin: Skin is warm and dry.  Psychiatric: She has a normal mood and affect.  EFM: 135 bpm, mod variability, +accels, no decels Toco: irregular, mild  Results for orders placed or performed during the hospital encounter of 04/18/16 (from the past 48 hour(s))  Urinalysis, Routine w reflex microscopic (  not at [facility])     Status: Abnormal   Collection Time: 04/18/16 12:50 PM  Result Value Ref Range   Color, Urine YELLOW YELLOW   APPearance CLEAR CLEAR   Specific Gravity, Urine 1.010 1.005 - 1.030   pH 5.5 5.0 - 8.0   Glucose, UA NEGATIVE NEGATIVE mg/dL   Hgb urine dipstick NEGATIVE NEGATIVE   Bilirubin Urine NEGATIVE NEGATIVE   Ketones, ur NEGATIVE NEGATIVE mg/dL   Protein, ur NEGATIVE NEGATIVE mg/dL   Nitrite NEGATIVE NEGATIVE   Leukocytes, UA LARGE (A) NEGATIVE  Urine microscopic-add on     Status: Abnormal   Collection Time: 04/18/16 12:50 PM  Result Value Ref Range   Squamous  Epithelial / LPF 0-5 (A) NONE SEEN   WBC, UA 6-30 0 - 5 WBC/hpf   RBC / HPF NONE SEEN 0 - 5 RBC/hpf   Bacteria, UA RARE (A) NONE SEEN   Urine-Other YEAST PRESENT   Amnisure rupture of membrane (rom)not at [facility]     Status: None   Collection Time: 04/18/16  1:50 PM  Result Value Ref Range   Amnisure ROM NEGATIVE     MAU Course  Procedures  MDM Amnisure. No evidence of PROM or PTL. Likely etiology yeast vaginitis. Discussed assessment and findings with Alison Duran, agrees with plan. Stable for discharge home.  Assessment and Plan  31.[redacted] weeks gestation Yeast vaginitis Leukorrhea Reactive NST  Discharge home PTL precautions Start Rx for yeast (sent from office) Follow up as scheduled in office next week  Alison Duran 04/18/2016, 1:58 PM

## 2016-04-18 NOTE — Discharge Instructions (Signed)

## 2016-05-16 LAB — OB RESULTS CONSOLE GBS: GBS: NEGATIVE

## 2016-06-11 ENCOUNTER — Inpatient Hospital Stay (HOSPITAL_COMMUNITY)
Admit: 2016-06-11 | Discharge: 2016-06-14 | DRG: 775 | Disposition: A | Discharge status: Home / Self Care | Payer: BLUE CROSS/BLUE SHIELD | Source: Ambulatory Visit | Attending: Obstetrics and Gynecology | Admitting: Obstetrics and Gynecology

## 2016-06-11 ENCOUNTER — Encounter (HOSPITAL_COMMUNITY): Payer: Self-pay

## 2016-06-11 DIAGNOSIS — Z3A39 39 weeks gestation of pregnancy: Secondary | ICD-10-CM | POA: Diagnosis not present

## 2016-06-11 DIAGNOSIS — E042 Nontoxic multinodular goiter: Secondary | ICD-10-CM | POA: Diagnosis present

## 2016-06-11 DIAGNOSIS — O9902 Anemia complicating childbirth: Secondary | ICD-10-CM | POA: Diagnosis present

## 2016-06-11 DIAGNOSIS — K219 Gastro-esophageal reflux disease without esophagitis: Secondary | ICD-10-CM | POA: Diagnosis present

## 2016-06-11 DIAGNOSIS — O9962 Diseases of the digestive system complicating childbirth: Secondary | ICD-10-CM | POA: Diagnosis present

## 2016-06-11 LAB — TYPE AND SCREEN
ABO/RH(D): A POS
Antibody Screen: NEGATIVE

## 2016-06-11 LAB — CBC
HCT: 33.9 % — ABNORMAL LOW (ref 36.0–46.0)
Hemoglobin: 11.1 g/dL — ABNORMAL LOW (ref 12.0–15.0)
MCH: 29.1 pg (ref 26.0–34.0)
MCHC: 32.7 g/dL (ref 30.0–36.0)
MCV: 88.7 fL (ref 78.0–100.0)
PLATELETS: 183 10*3/uL (ref 150–400)
RBC: 3.82 MIL/uL — ABNORMAL LOW (ref 3.87–5.11)
RDW: 13.9 % (ref 11.5–15.5)
WBC: 16.1 10*3/uL — ABNORMAL HIGH (ref 4.0–10.5)

## 2016-06-11 MED ORDER — OXYTOCIN BOLUS FROM INFUSION
500.0000 mL | Freq: Once | INTRAVENOUS | Status: AC
  Administered 2016-06-12: 500 mL via INTRAVENOUS

## 2016-06-11 MED ORDER — OXYCODONE-ACETAMINOPHEN 5-325 MG PO TABS: 1.0000 | ORAL_TABLET | ORAL | Status: DC | PRN

## 2016-06-11 MED ORDER — OXYCODONE-ACETAMINOPHEN 5-325 MG PO TABS: 2.0000 | ORAL_TABLET | ORAL | Status: DC | PRN

## 2016-06-11 MED ORDER — OXYTOCIN 40 UNITS IN LACTATED RINGERS INFUSION - SIMPLE MED
2.5000 [IU]/h | INTRAVENOUS | Status: DC
  Administered 2016-06-12: 2.5 [IU]/h via INTRAVENOUS
  Filled 2016-06-11: qty 1000

## 2016-06-11 MED ORDER — ONDANSETRON HCL 4 MG/2ML IJ SOLN: 4.0000 mg | Freq: Four times a day (QID) | INTRAMUSCULAR | Status: DC | PRN

## 2016-06-11 MED ORDER — LACTATED RINGERS IV SOLN: 500.0000 mL | INTRAVENOUS | Status: DC | PRN

## 2016-06-11 MED ORDER — LACTATED RINGERS IV SOLN: INTRAVENOUS | Status: DC

## 2016-06-11 MED ORDER — SOD CITRATE-CITRIC ACID 500-334 MG/5ML PO SOLN: 30.0000 mL | ORAL | Status: DC | PRN

## 2016-06-11 MED ORDER — ACETAMINOPHEN 325 MG PO TABS: 650.0000 mg | ORAL_TABLET | ORAL | Status: DC | PRN

## 2016-06-11 MED ORDER — LIDOCAINE HCL (PF) 1 % IJ SOLN
30.0000 mL | INTRAMUSCULAR | Status: DC | PRN
  Filled 2016-06-11: qty 30

## 2016-06-11 MED ORDER — FLEET ENEMA 7-19 GM/118ML RE ENEM: 1.0000 | ENEMA | RECTAL | Status: DC | PRN

## 2016-06-11 NOTE — MAU Note (Signed)
Contractions every 3-5 mins since 5pm. Had membranes swept this morning. Denies LOF. Has some bloody mucous. +FM. Cervix was 3cm this morning.

## 2016-06-12 ENCOUNTER — Encounter (HOSPITAL_COMMUNITY): Payer: Self-pay | Admitting: *Deleted

## 2016-06-12 LAB — CBC
HCT: 31.1 % — ABNORMAL LOW (ref 36.0–46.0)
Hemoglobin: 10.5 g/dL — ABNORMAL LOW (ref 12.0–15.0)
MCH: 29.4 pg (ref 26.0–34.0)
MCHC: 33.8 g/dL (ref 30.0–36.0)
MCV: 87.1 fL (ref 78.0–100.0)
PLATELETS: 176 10*3/uL (ref 150–400)
RBC: 3.57 MIL/uL — AB (ref 3.87–5.11)
RDW: 13.7 % (ref 11.5–15.5)
WBC: 20.9 10*3/uL — ABNORMAL HIGH (ref 4.0–10.5)

## 2016-06-12 MED ORDER — SENNOSIDES-DOCUSATE SODIUM 8.6-50 MG PO TABS
2.0000 | ORAL_TABLET | ORAL | Status: DC
  Administered 2016-06-12 – 2016-06-13 (×2): 2 via ORAL
  Filled 2016-06-12 (×2): qty 2

## 2016-06-12 MED ORDER — EPHEDRINE 5 MG/ML INJ
10.0000 mg | INTRAVENOUS | Status: DC | PRN
  Filled 2016-06-12: qty 4

## 2016-06-12 MED ORDER — WITCH HAZEL-GLYCERIN EX PADS: 1.0000 "application " | MEDICATED_PAD | CUTANEOUS | Status: DC | PRN

## 2016-06-12 MED ORDER — OXYCODONE-ACETAMINOPHEN 5-325 MG PO TABS: 1.0000 | ORAL_TABLET | ORAL | Status: DC | PRN

## 2016-06-12 MED ORDER — TETANUS-DIPHTH-ACELL PERTUSSIS 5-2.5-18.5 LF-MCG/0.5 IM SUSP: 0.5000 mL | Freq: Once | INTRAMUSCULAR | Status: DC

## 2016-06-12 MED ORDER — DIPHENHYDRAMINE HCL 25 MG PO CAPS: 25.0000 mg | ORAL_CAPSULE | Freq: Four times a day (QID) | ORAL | Status: DC | PRN

## 2016-06-12 MED ORDER — OXYCODONE-ACETAMINOPHEN 5-325 MG PO TABS: 2.0000 | ORAL_TABLET | ORAL | Status: DC | PRN

## 2016-06-12 MED ORDER — ONDANSETRON HCL 4 MG/2ML IJ SOLN: 4.0000 mg | INTRAMUSCULAR | Status: DC | PRN

## 2016-06-12 MED ORDER — BENZOCAINE-MENTHOL 20-0.5 % EX AERO: 1.0000 "application " | INHALATION_SPRAY | CUTANEOUS | Status: DC | PRN

## 2016-06-12 MED ORDER — IBUPROFEN 600 MG PO TABS
600.0000 mg | ORAL_TABLET | Freq: Four times a day (QID) | ORAL | Status: DC
  Administered 2016-06-12 – 2016-06-14 (×9): 600 mg via ORAL
  Filled 2016-06-12 (×9): qty 1

## 2016-06-12 MED ORDER — ONDANSETRON HCL 4 MG PO TABS: 4.0000 mg | ORAL_TABLET | ORAL | Status: DC | PRN

## 2016-06-12 MED ORDER — DIBUCAINE 1 % RE OINT: 1.0000 "application " | TOPICAL_OINTMENT | RECTAL | Status: DC | PRN

## 2016-06-12 MED ORDER — DIPHENHYDRAMINE HCL 50 MG/ML IJ SOLN: 12.5000 mg | INTRAMUSCULAR | Status: DC | PRN

## 2016-06-12 MED ORDER — ZOLPIDEM TARTRATE 5 MG PO TABS: 5.0000 mg | ORAL_TABLET | Freq: Every evening | ORAL | Status: DC | PRN

## 2016-06-12 MED ORDER — MEDROXYPROGESTERONE ACETATE 150 MG/ML IM SUSP: 150.0000 mg | INTRAMUSCULAR | Status: DC | PRN

## 2016-06-12 MED ORDER — MEASLES, MUMPS & RUBELLA VAC ~~LOC~~ INJ
0.5000 mL | INJECTION | Freq: Once | SUBCUTANEOUS | Status: DC
  Filled 2016-06-12: qty 0.5

## 2016-06-12 MED ORDER — ACETAMINOPHEN 325 MG PO TABS
650.0000 mg | ORAL_TABLET | ORAL | Status: DC | PRN
Start: 2016-06-12 — End: 2016-06-14

## 2016-06-12 MED ORDER — FENTANYL 2.5 MCG/ML BUPIVACAINE 1/10 % EPIDURAL INFUSION (WH - ANES): 14.0000 mL/h | INTRAMUSCULAR | Status: DC | PRN

## 2016-06-12 MED ORDER — LACTATED RINGERS IV SOLN: 500.0000 mL | Freq: Once | INTRAVENOUS | Status: DC

## 2016-06-12 MED ORDER — PRENATAL MULTIVITAMIN CH
1.0000 | ORAL_TABLET | Freq: Every day | ORAL | Status: DC
  Administered 2016-06-12 – 2016-06-13 (×2): 1 via ORAL
  Filled 2016-06-12 (×2): qty 1

## 2016-06-12 MED ORDER — PHENYLEPHRINE 40 MCG/ML (10ML) SYRINGE FOR IV PUSH (FOR BLOOD PRESSURE SUPPORT)
80.0000 ug | PREFILLED_SYRINGE | INTRAVENOUS | Status: DC | PRN
  Filled 2016-06-12: qty 5

## 2016-06-12 MED ORDER — COCONUT OIL OIL
1.0000 "application " | TOPICAL_OIL | Status: DC | PRN
  Administered 2016-06-12: 1 via TOPICAL
  Filled 2016-06-12: qty 120

## 2016-06-12 MED ORDER — SIMETHICONE 80 MG PO CHEW: 80.0000 mg | CHEWABLE_TABLET | ORAL | Status: DC | PRN

## 2016-06-12 NOTE — Lactation Note (Signed)
This note was copied from a baby's chart. Lactation Consultation Note  Patient Name: Alison Duran [id number] Date: 06/12/2016 Reason for consult: Follow-up assessment  Baby is 12 hours old, 2nd LC visit for today .  Mom called out for [facility] assessment , when LC walked in the room baby already latched  With descend depth , fair alignment. LC had mom release for few seconds, hand expressed,  Re- latched in correct body alignment and mom and baby seemed more comfortable.  Multiply swallows noted, increased with breast compressions. Baby fed 25 mins.  Mom was asking whether Spectra DEBP was a reliable pump.  LC gave mom information gathered from other moms that it is reliable.  Mom and baby are off to a good start breast feeding.    Maternal Data Has patient been taught Hand Expression?: Yes Does the patient have breastfeeding experience prior to this delivery?: Yes  Feeding Feeding Type:  (baby latched /see LC note ) Length of feed: 25 min (LC obs baby already latched w/ aligment being off/ see LC no)  LATCH Score/Interventions Latch: Grasps breast easily, tongue down, lips flanged, rhythmical sucking. (after 5 mins of feeding - off sec , relatched w/ aligment )  Audible Swallowing: Spontaneous and intermittent  Type of Nipple: Everted at rest and after stimulation  Comfort (Breast/Nipple): Soft / non-tender     Hold (Positioning): Assistance needed to correctly position infant at breast and maintain latch. Intervention(s): Breastfeeding basics reviewed;Support Pillows;Position options;Skin to skin  LATCH Score: 9  Lactation Tools Discussed/Used WIC Program: No   Consult Status Consult Status: Follow-up Date: 06/13/16 Follow-up type: In-patient    Kathrin Greathouse 06/12/2016, 2:29 PM

## 2016-06-12 NOTE — Progress Notes (Signed)
Post Partum Day 0 Subjective: no complaints, up ad lib, voiding and tolerating PO  Objective: Blood pressure 109/68, pulse 75, temperature 97.7 F (36.5 C), temperature source Oral, resp. rate 18, height 5\' 6"  (1.676 m), weight 156 lb (70.8 kg), SpO2 100 %, unknown if currently breastfeeding.  Physical Exam:  General: alert and cooperative Lochia: appropriate Uterine Fundus: firm Incision: healing well, small labial edema DVT Evaluation: No evidence of DVT seen on physical exam. Negative Homan's sign. No cords or calf tenderness. No significant calf/ankle edema.   Recent Labs  06/11/16 2150 06/12/16 0539  HGB 11.1* 10.5*  HCT 33.9* 31.1*    Assessment/Plan: Plan for discharge tomorrow and Circumcision prior to discharge   LOS: 1 day   Garrett Mitchum G 06/12/2016, 8:21 AM

## 2016-06-12 NOTE — H&P (Signed)
Alison Duran is a 25 y.o. female presenting for active labor.  Pregnancy uncomplicated.  GBS -. OB History    Gravida Para Term Preterm AB Living   2 1 1  0 0 1   SAB TAB Ectopic Multiple Live Births   0 0 0 0 1     Past Medical History:  Diagnosis Date  . Anemia    takes Iron twice daily  . GERD (gastroesophageal reflux disease)    with pregnancy only; takes no meds  . Multiple thyroid nodules    Has to have them checked once a year   Past Surgical History:  Procedure Laterality Date  . SHOULDER SURGERY Right   . WISDOM TOOTH EXTRACTION     Family History: family history includes Hypertension in her father. Social History:  reports that she has never smoked. She has never used smokeless tobacco. She reports that she does not drink alcohol or use drugs.     Maternal Diabetes: No Genetic Screening: Normal Maternal Ultrasounds/Referrals: Normal Fetal Ultrasounds or other Referrals:  None Maternal Substance Abuse:  No Significant Maternal Medications:  None Significant Maternal Lab Results:  None Other Comments:  None  ROS History Dilation: 8 Effacement (%): 90 Station: 0 Exam by:: Raliegh Ip RN Blood pressure 113/68, pulse 100, temperature 98.4 F (36.9 C), temperature source Oral, resp. rate 20, height 5\' 6"  (1.676 m), weight 156 lb (70.8 kg), SpO2 100 %, unknown if currently breastfeeding. Exam Physical Exam  Prenatal labs: ABO, Rh: --/--/A POS (08/01 2150) Antibody: NEG (08/01 2150) Rubella: Immune (01/06 0000) RPR: Nonreactive (01/06 0000)  HBsAg: Negative (01/06 0000)  HIV: Non-reactive (01/06 0000)  GBS: Negative (07/06 0000)   Assessment/Plan: IUP at term Active labor Anticipate SVD   Jomo Forand C 06/12/2016, 1:57 AM

## 2016-06-12 NOTE — Lactation Note (Signed)
This note was copied from a baby's chart. Lactation Consultation Note  Patient Name: Boy Ricarda Kless [id number] Date: 06/12/2016 Reason for consult: Initial assessment (mom aware to call on the nurses light for feeding assessment )  Baby is 11 hours old and has been to the breast several times.  This an experienced BF mom of 18 months with her 1st baby .  Per mom baby last fed at 12 N for 25 mins with lots of swallows and a large stool.  Presently mom is holding baby and he is asleep.  Per mom with 1st baby had to use a NS and she seemed surprised this abby latched right on after delivery.  Mother informed of post-discharge support and given phone number to the lactation department, including services  for phone call assistance; out-patient appointments; and breastfeeding support group. List of other breastfeeding resources  in the community given in the handout. Encouraged mother to call for problems or concerns related to breastfeeding.   Maternal Data Has patient been taught Hand Expression?:  (grand mother and daughter in the room , unable to review technique ) Does the patient have breastfeeding experience prior to this delivery?: Yes  Feeding Feeding Type: Breast Fed Length of feed: 25 min (per mom )  LATCH Score/Interventions                      Lactation Tools Discussed/Used     Consult Status Consult Status: Follow-up Date: 06/12/16 Follow-up type: In-patient    Kathrin Greathouse 06/12/2016, 1:12 PM

## 2016-06-13 LAB — RPR: RPR Ser Ql: NONREACTIVE

## 2016-06-13 NOTE — Lactation Note (Signed)
This note was copied from a baby's chart. Lactation Consultation Note  Patient Name: Alison Duran [id number] Date: 06/13/2016 Reason for consult: Follow-up assessment;Other (Comment) (2% weight loss )  Baby is post circ. And per mom last fed at 1100 for 15 mins. Wet and stool  At that time.  Per mom very pleased with the baby's feedings. LC reviewed doc flow sheets, baby has been consistent  At the breast since birth, voids and stools QS. Latch scores range 8-10 's.  Yesterday LC observed a feeding and Latch score was 9, multiply swallows noted.  Baby sleeping now, LC unable to observe today.  Baby alittle spitty when LC in room , reviewed bulb syringe.  Sore nipple and engorgement prevention and tx reviewed.  As preventive - instructed mom on the use shells , hand pump for when milk comes in and cleaning.   Per mom has a DEBP at home.  RN gave mom coconut oil , per mom not sore - preventive. LC recommended breast massage,  Hand express - pre- pump on the 1st breast if needed to make the nipple / areola complex more  Elastic. Also due to being her 2nd baby if to full to start express of the 1st breast enough so baby  Can obtain a deep latch and use breast compression with latch until swallows and then intermittent.  If the baby only feeds 1st breast, release 2nd breast down to comfort to prevent engorgement.  Mom and grand mother receptive to teaching.  Mom to call PRN.     Maternal Data    Feeding Feeding Type:  (per mom baby recently breast fed at 1100 for 15 mins ) Length of feed: 15 min (per mom and lots of swallows )  LATCH Score/Interventions                Intervention(s): Breastfeeding basics reviewed     Lactation Tools Discussed/Used Tools: Shells;Pump (mom doesn't need today - preparing for D/C tomorrow ) Shell Type: Inverted Breast pump type: Manual WIC Program: No   Consult Status Consult Status: PRN Date: 06/14/16 Follow-up type:  In-patient    Kathrin Greathouse 06/13/2016, 11:58 AM

## 2016-06-13 NOTE — Progress Notes (Signed)
Patient doing well. No complaints. BP (!) 96/52   Pulse 72   Temp 98 F (36.7 C) (Oral)   Resp 18   Ht 5\' 6"  (1.676 m)   Wt 70.8 kg (156 lb)   SpO2 100%   Breastfeeding? Unknown   BMI 25.18 kg/m  Abdomen is soft and non tender  No results found for this or any previous visit (from the past 24 hour(s)). Impression: PPD #1 Doing well Routine care Discharge tomorrow Circ today

## 2016-06-14 MED ORDER — IBUPROFEN 600 MG PO TABS: 600.0000 mg | ORAL_TABLET | Freq: Four times a day (QID) | ORAL | 0 refills | Status: DC

## 2016-06-14 NOTE — Discharge Summary (Signed)
Obstetric Discharge Summary Reason for Admission: onset of labor Prenatal Procedures: none Intrapartum Procedures: spontaneous vaginal delivery Postpartum Procedures: none Complications-Operative and Postpartum: none Hemoglobin  Date Value Ref Range Status  06/12/2016 10.5 (L) 12.0 - 15.0 g/dL Final   HCT  Date Value Ref Range Status  06/12/2016 31.1 (L) 36.0 - 46.0 % Final    Physical Exam:  General: alert, cooperative and appears stated age 25: appropriate Uterine Fundus: firm Incision: n/a DVT Evaluation: No evidence of DVT seen on physical exam. Negative Homan's sign. No cords or calf tenderness.  Discharge Diagnoses: Term Pregnancy-delivered  Discharge Information: Date: 06/14/2016 Activity: pelvic rest Diet: routine Medications: PNV and Ibuprofen Condition: stable Instructions: refer to practice specific booklet Discharge to: home   Newborn Data: Live born female  Birth Weight: 6 lb 11.2 oz (3040 g) APGAR: 9, 9  Home with mother.  Alison Duran 06/14/2016, 8:12 AM

## 2016-06-14 NOTE — Discharge Instructions (Signed)
Call MD for T>100.4, heavy vaginal bleeding, or respiratory distress.  Call office to schedule postpartum visit in 4-6 weeks.  Pelvic rest x 6 weeks.   °

## 2016-06-14 NOTE — Lactation Note (Signed)
This note was copied from a baby's chart. Lactation Consultation Note Experienced BF mom of 18 months states this baby has latched well and has no pain. Discussed mastitis prevention and engorgement management. Mom had mastitis at the beginning and the end of BF her first child. Mom plans to BF as long as she can. Mom was 2 months pregnant with baby "Alison Duran" when she stopped BF. Reviewed OP services and support groups. Denied any questions or concerns. Patient Name: Alison Duran [id number] Date: 06/14/2016 Reason for consult: Follow-up assessment   Maternal Data Has patient been taught Hand Expression?: Yes Does the patient have breastfeeding experience prior to this delivery?: Yes  Feeding    LATCH Score/Interventions                      Lactation Tools Discussed/Used     Consult Status Consult Status: Complete Date: 06/14/16    Charyl Dancer 06/14/2016, 10:40 AM

## 2016-06-14 NOTE — Progress Notes (Addendum)
Post Partum Day 2 Subjective: no complaints, up ad lib, voiding, tolerating PO and + flatus  Objective: Blood pressure 108/66, pulse 72, temperature 98.1 F (36.7 C), resp. rate 16, height 5\' 6"  (1.676 m), weight 156 lb (70.8 kg), SpO2 100 %, unknown if currently breastfeeding.  Physical Exam:  General: alert, cooperative and appears stated age Lochia: appropriate Uterine Fundus: firm Incision: n/a DVT Evaluation: No evidence of DVT seen on physical exam. Negative Homan's sign. No cords or calf tenderness.   Recent Labs  06/11/16 2150 06/12/16 0539  HGB 11.1* 10.5*  HCT 33.9* 31.1*    Assessment/Plan: Discharge home   LOS: 3 days   Alison Duran 06/14/2016, 8:09 AM

## 2016-06-19 ENCOUNTER — Inpatient Hospital Stay (HOSPITAL_COMMUNITY)
Admission: AD | Admit: 2016-06-19 | Discharge: 2016-06-20 | Disposition: A | Discharge status: Home / Self Care | Payer: BLUE CROSS/BLUE SHIELD | Source: Ambulatory Visit | Attending: Obstetrics & Gynecology | Admitting: Obstetrics & Gynecology

## 2016-06-19 DIAGNOSIS — K219 Gastro-esophageal reflux disease without esophagitis: Secondary | ICD-10-CM | POA: Insufficient documentation

## 2016-06-19 DIAGNOSIS — Z91013 Allergy to seafood: Secondary | ICD-10-CM | POA: Insufficient documentation

## 2016-06-19 DIAGNOSIS — Z885 Allergy status to narcotic agent status: Secondary | ICD-10-CM | POA: Insufficient documentation

## 2016-06-19 DIAGNOSIS — R51 Headache: Secondary | ICD-10-CM | POA: Insufficient documentation

## 2016-06-19 DIAGNOSIS — O9089 Other complications of the puerperium, not elsewhere classified: Secondary | ICD-10-CM | POA: Insufficient documentation

## 2016-06-19 DIAGNOSIS — Z881 Allergy status to other antibiotic agents status: Secondary | ICD-10-CM | POA: Insufficient documentation

## 2016-06-19 DIAGNOSIS — R519 Headache, unspecified: Secondary | ICD-10-CM

## 2016-06-19 DIAGNOSIS — O165 Unspecified maternal hypertension, complicating the puerperium: Secondary | ICD-10-CM | POA: Insufficient documentation

## 2016-06-20 ENCOUNTER — Encounter (HOSPITAL_COMMUNITY): Payer: Self-pay

## 2016-06-20 DIAGNOSIS — Z91013 Allergy to seafood: Secondary | ICD-10-CM | POA: Diagnosis not present

## 2016-06-20 DIAGNOSIS — R51 Headache: Secondary | ICD-10-CM | POA: Diagnosis present

## 2016-06-20 DIAGNOSIS — O165 Unspecified maternal hypertension, complicating the puerperium: Secondary | ICD-10-CM | POA: Diagnosis not present

## 2016-06-20 DIAGNOSIS — K219 Gastro-esophageal reflux disease without esophagitis: Secondary | ICD-10-CM | POA: Diagnosis not present

## 2016-06-20 DIAGNOSIS — Z885 Allergy status to narcotic agent status: Secondary | ICD-10-CM | POA: Diagnosis not present

## 2016-06-20 DIAGNOSIS — O9089 Other complications of the puerperium, not elsewhere classified: Secondary | ICD-10-CM | POA: Diagnosis not present

## 2016-06-20 DIAGNOSIS — Z881 Allergy status to other antibiotic agents status: Secondary | ICD-10-CM | POA: Diagnosis not present

## 2016-06-20 LAB — COMPREHENSIVE METABOLIC PANEL
ALT: 13 U/L — ABNORMAL LOW (ref 14–54)
ANION GAP: 4 — AB (ref 5–15)
AST: 13 U/L — ABNORMAL LOW (ref 15–41)
Albumin: 3.7 g/dL (ref 3.5–5.0)
Alkaline Phosphatase: 95 U/L (ref 38–126)
BILIRUBIN TOTAL: 0.3 mg/dL (ref 0.3–1.2)
BUN: 9 mg/dL (ref 6–20)
CHLORIDE: 107 mmol/L (ref 101–111)
CO2: 25 mmol/L (ref 22–32)
Calcium: 8.5 mg/dL — ABNORMAL LOW (ref 8.9–10.3)
Creatinine, Ser: 0.67 mg/dL (ref 0.44–1.00)
Glucose, Bld: 100 mg/dL — ABNORMAL HIGH (ref 65–99)
POTASSIUM: 4 mmol/L (ref 3.5–5.1)
Sodium: 136 mmol/L (ref 135–145)
TOTAL PROTEIN: 7.2 g/dL (ref 6.5–8.1)

## 2016-06-20 LAB — PROTEIN / CREATININE RATIO, URINE: CREATININE, URINE: 65 mg/dL

## 2016-06-20 LAB — URINALYSIS, ROUTINE W REFLEX MICROSCOPIC
BILIRUBIN URINE: NEGATIVE
GLUCOSE, UA: NEGATIVE mg/dL
Ketones, ur: NEGATIVE mg/dL
Nitrite: NEGATIVE
Protein, ur: NEGATIVE mg/dL
SPECIFIC GRAVITY, URINE: 1.01 (ref 1.005–1.030)
pH: 6 (ref 5.0–8.0)

## 2016-06-20 LAB — CBC
HEMATOCRIT: 35.5 % — AB (ref 36.0–46.0)
Hemoglobin: 11.5 g/dL — ABNORMAL LOW (ref 12.0–15.0)
MCH: 29.5 pg (ref 26.0–34.0)
MCHC: 32.4 g/dL (ref 30.0–36.0)
MCV: 91 fL (ref 78.0–100.0)
PLATELETS: 218 10*3/uL (ref 150–400)
RBC: 3.9 MIL/uL (ref 3.87–5.11)
RDW: 13.3 % (ref 11.5–15.5)
WBC: 6.6 10*3/uL (ref 4.0–10.5)

## 2016-06-20 LAB — URINE MICROSCOPIC-ADD ON: Bacteria, UA: NONE SEEN

## 2016-06-20 LAB — URIC ACID: URIC ACID, SERUM: 5.1 mg/dL (ref 2.3–6.6)

## 2016-06-20 LAB — LACTATE DEHYDROGENASE: LDH: 138 U/L (ref 98–192)

## 2016-06-20 MED ORDER — ACETAMINOPHEN 325 MG PO TABS: 650.0000 mg | ORAL_TABLET | Freq: Once | ORAL | Status: DC

## 2016-06-20 NOTE — Discharge Instructions (Signed)

## 2016-06-20 NOTE — MAU Provider Note (Signed)
History     CSN: [id number]  Arrival date and time: 06/19/16 2343   First Provider Initiated Contact with Patient 06/20/16 0041      Chief Complaint  Patient presents with  . Hypertension  . Headache   HPI Alison Duran is a 25 y.o. 82P2002 female who presents 1 week post partum for headache & elevated BP. OB history significant for preeclampsia in previous pregnancy. Denies hypertension since then and no elevated BPs during this pregnancy. Reports daily headaches since being discharged from the hospital. Currently has headache she rates 4/10. Describes as dull frontal headache. Has not treated. Denies vision changes, epigastric pain, n/v.    OB History    Gravida Para Term Preterm AB Living   2 2 2  0 0 2   SAB TAB Ectopic Multiple Live Births   0 0 0 0 2      Past Medical History:  Diagnosis Date  . Anemia    takes Iron twice daily  . GERD (gastroesophageal reflux disease)    with pregnancy only; takes no meds  . Multiple thyroid nodules    Has to have them checked once a year    Past Surgical History:  Procedure Laterality Date  . SHOULDER SURGERY Right   . WISDOM TOOTH EXTRACTION      Family History  Problem Relation Age of Onset  . Hypertension Father     Social History  Substance Use Topics  . Smoking status: Never Smoker  . Smokeless tobacco: Never Used  . Alcohol use No    Allergies:  Allergies  Allergen Reactions  . Codeine Nausea And Vomiting  . Erythromycin Nausea And Vomiting  . Food Other (See Comments)    Potatoes and sesame seeds, pt preference  . Shellfish Allergy Other (See Comments)    Pt preference    Prescriptions Prior to Admission  Medication Sig Dispense Refill Last Dose  . ibuprofen (ADVIL,MOTRIN) 600 MG tablet Take 1 tablet (600 mg total) by mouth every 6 (six) hours. 30 tablet 0   . Prenatal Vit-Fe Fumarate-FA (PRENATAL MULTIVITAMIN) TABS tablet Take 1 tablet by mouth daily at 12 noon.    06/10/2016 at Unknown time     Review of Systems  Constitutional: Negative for chills and fever.  Eyes: Negative for blurred vision and double vision.  Cardiovascular: Negative.   Gastrointestinal: Positive for abdominal pain (irregular lower abdominal cramping).  Genitourinary: Negative.   Neurological: Positive for headaches. Negative for dizziness.   Physical Exam   Blood pressure 129/92, pulse 66, temperature 98.1 F (36.7 C), temperature source Oral, resp. rate 18, height 5\' 6"  (1.676 m), weight 140 lb 3.2 oz (63.6 kg), unknown if currently breastfeeding.  Temp:  [98 F (36.7 C)-98.1 F (36.7 C)] 98 F (36.7 C) (08/10 0231) Pulse Rate:  [63-79] 65 (08/10 0231) Resp:  [18] 18 (08/10 0231) BP: (113-134)/(83-99) 113/85 (08/10 0231) Weight:  [140 lb 3.2 oz (63.6 kg)] 140 lb 3.2 oz (63.6 kg) (08/10 0008)   Physical Exam  Nursing note and vitals reviewed. Constitutional: She is oriented to person, place, and time. She appears well-developed and well-nourished. No distress.  HENT:  Head: Normocephalic and atraumatic.  Eyes: Conjunctivae are normal. Right eye exhibits no discharge. Left eye exhibits no discharge. No scleral icterus.  Neck: Normal range of motion.  Cardiovascular: Normal rate, regular rhythm and normal heart sounds.   No murmur heard. Respiratory: Effort normal and breath sounds normal. No respiratory distress. She has no wheezes.  GI:  Soft. There is no tenderness.  Musculoskeletal: She exhibits no edema.  Neurological: She is alert and oriented to person, place, and time. She has normal reflexes.  No clonus  Skin: Skin is warm and dry. She is not diaphoretic.  Psychiatric: She has a normal mood and affect. Her behavior is normal. Judgment and thought content normal.    MAU Course  Procedures Results for orders placed or performed during the hospital encounter of 06/19/16 (from the past 24 hour(s))  CBC     Status: Abnormal   Collection Time: 06/20/16 12:24 AM  Result Value Ref  Range   WBC 6.6 4.0 - 10.5 K/uL   RBC 3.90 3.87 - 5.11 MIL/uL   Hemoglobin 11.5 (L) 12.0 - 15.0 g/dL   HCT 16.1 (L) 09.6 - 04.5 %   MCV 91.0 78.0 - 100.0 fL   MCH 29.5 26.0 - 34.0 pg   MCHC 32.4 30.0 - 36.0 g/dL   RDW 40.9 81.1 - 91.4 %   Platelets 218 150 - 400 K/uL  Comprehensive metabolic panel     Status: Abnormal   Collection Time: 06/20/16 12:24 AM  Result Value Ref Range   Sodium 136 135 - 145 mmol/L   Potassium 4.0 3.5 - 5.1 mmol/L   Chloride 107 101 - 111 mmol/L   CO2 25 22 - 32 mmol/L   Glucose, Bld 100 (H) 65 - 99 mg/dL   BUN 9 6 - 20 mg/dL   Creatinine, Ser 7.82 0.44 - 1.00 mg/dL   Calcium 8.5 (L) 8.9 - 10.3 mg/dL   Total Protein 7.2 6.5 - 8.1 g/dL   Albumin 3.7 3.5 - 5.0 g/dL   AST 13 (L) 15 - 41 U/L   ALT 13 (L) 14 - 54 U/L   Alkaline Phosphatase 95 38 - 126 U/L   Total Bilirubin 0.3 0.3 - 1.2 mg/dL   GFR calc non Af Amer >60 >60 mL/min   GFR calc Af Amer >60 >60 mL/min   Anion gap 4 (L) 5 - 15  Lactate dehydrogenase     Status: None   Collection Time: 06/20/16 12:24 AM  Result Value Ref Range   LDH 138 98 - 192 U/L  Uric acid     Status: None   Collection Time: 06/20/16 12:24 AM  Result Value Ref Range   Uric Acid, Serum 5.1 2.3 - 6.6 mg/dL  Urinalysis, Routine w reflex microscopic (not at [facility])     Status: Abnormal   Collection Time: 06/20/16  1:00 AM  Result Value Ref Range   Color, Urine YELLOW YELLOW   APPearance CLEAR CLEAR   Specific Gravity, Urine 1.010 1.005 - 1.030   pH 6.0 5.0 - 8.0   Glucose, UA NEGATIVE NEGATIVE mg/dL   Hgb urine dipstick TRACE (A) NEGATIVE   Bilirubin Urine NEGATIVE NEGATIVE   Ketones, ur NEGATIVE NEGATIVE mg/dL   Protein, ur NEGATIVE NEGATIVE mg/dL   Nitrite NEGATIVE NEGATIVE   Leukocytes, UA TRACE (A) NEGATIVE  Protein / creatinine ratio, urine     Status: None   Collection Time: 06/20/16  1:00 AM  Result Value Ref Range   Creatinine, Urine 65.00 mg/dL   Total Protein, Urine <6 mg/dL   Protein Creatinine Ratio         0.00 - 0.15 mg/mg[Cre]  Urine microscopic-add on     Status: Abnormal   Collection Time: 06/20/16  1:00 AM  Result Value Ref Range   Squamous Epithelial / LPF 0-5 (A) NONE SEEN  WBC, UA 0-5 0 - 5 WBC/hpf   RBC / HPF 0-5 0 - 5 RBC/hpf   Bacteria, UA NONE SEEN NONE SEEN    MDM Elevated BPs, no severe range BPs Pt declines tylenol CBC, CMP, uric acid, LDH, urine PCR Labs WNL S/w Dr. Langston Masker regarding pt, BPs, & labs. Ok to discharge home. Return to office on Friday for BP check Assessment and Plan  A:  1. Postpartum hypertension   2. Postpartum headache     P: Discharge home Discussed reasons to return to MAU Call office in morning to schedule BP check on Friday  Judeth Horn 06/20/2016, 12:22 AM

## 2016-06-20 NOTE — MAU Note (Signed)
Pt reports she has been having headache off and on since she delivered last week. Took B/P at home 139/99 and went to fire station  and it was 145/104. Denies any bluured vision and spot. Just feels " off " today.

## 2017-03-26 ENCOUNTER — Ambulatory Visit
Admission: RE | Admit: 2017-03-26 | Discharge: 2017-03-26 | Disposition: A | Discharge status: Home / Self Care | Payer: BLUE CROSS/BLUE SHIELD | Source: Ambulatory Visit | Attending: Obstetrics and Gynecology | Admitting: Obstetrics and Gynecology

## 2017-03-26 ENCOUNTER — Other Ambulatory Visit: Payer: Self-pay | Admitting: Obstetrics and Gynecology

## 2017-03-26 DIAGNOSIS — N644 Mastodynia: Secondary | ICD-10-CM

## 2017-04-04 ENCOUNTER — Other Ambulatory Visit: Payer: BLUE CROSS/BLUE SHIELD

## 2018-12-10 LAB — OB RESULTS CONSOLE RUBELLA ANTIBODY, IGM: Rubella: IMMUNE

## 2018-12-10 LAB — OB RESULTS CONSOLE GC/CHLAMYDIA
Chlamydia: NEGATIVE
Gonorrhea: NEGATIVE

## 2018-12-10 LAB — OB RESULTS CONSOLE ABO/RH: RH Type: POSITIVE

## 2018-12-10 LAB — OB RESULTS CONSOLE ANTIBODY SCREEN: Antibody Screen: NEGATIVE

## 2018-12-10 LAB — OB RESULTS CONSOLE HEPATITIS B SURFACE ANTIGEN: Hepatitis B Surface Ag: NEGATIVE

## 2018-12-10 LAB — OB RESULTS CONSOLE HIV ANTIBODY (ROUTINE TESTING): HIV: NONREACTIVE

## 2018-12-10 LAB — OB RESULTS CONSOLE RPR: RPR: NONREACTIVE

## 2019-06-07 ENCOUNTER — Other Ambulatory Visit (HOSPITAL_COMMUNITY): Payer: Self-pay

## 2019-06-08 ENCOUNTER — Encounter (HOSPITAL_COMMUNITY): Payer: BLUE CROSS/BLUE SHIELD

## 2019-06-10 LAB — OB RESULTS CONSOLE GBS: GBS: NEGATIVE

## 2019-07-01 ENCOUNTER — Telehealth (HOSPITAL_COMMUNITY): Payer: Self-pay | Admitting: *Deleted

## 2019-07-01 ENCOUNTER — Encounter (HOSPITAL_COMMUNITY): Payer: Self-pay | Admitting: *Deleted

## 2019-07-01 NOTE — Telephone Encounter (Signed)
Preadmission screen  

## 2019-07-07 ENCOUNTER — Other Ambulatory Visit: Payer: Self-pay

## 2019-07-07 ENCOUNTER — Inpatient Hospital Stay (HOSPITAL_COMMUNITY)
Admission: AD | Admit: 2019-07-07 | Discharge: 2019-07-09 | DRG: 807 | Disposition: A | Discharge status: Home / Self Care | Payer: BC Managed Care – PPO | Attending: Obstetrics and Gynecology | Admitting: Obstetrics and Gynecology

## 2019-07-07 ENCOUNTER — Encounter (HOSPITAL_COMMUNITY): Payer: Self-pay | Admitting: *Deleted

## 2019-07-07 DIAGNOSIS — Z20828 Contact with and (suspected) exposure to other viral communicable diseases: Secondary | ICD-10-CM | POA: Diagnosis present

## 2019-07-07 DIAGNOSIS — O358XX0 Maternal care for other (suspected) fetal abnormality and damage, not applicable or unspecified: Secondary | ICD-10-CM | POA: Diagnosis present

## 2019-07-07 DIAGNOSIS — Z3A39 39 weeks gestation of pregnancy: Secondary | ICD-10-CM | POA: Diagnosis not present

## 2019-07-07 DIAGNOSIS — O26893 Other specified pregnancy related conditions, third trimester: Secondary | ICD-10-CM | POA: Diagnosis present

## 2019-07-07 LAB — CBC
HCT: 35.1 % — ABNORMAL LOW (ref 36.0–46.0)
Hemoglobin: 11.2 g/dL — ABNORMAL LOW (ref 12.0–15.0)
MCH: 30.1 pg (ref 26.0–34.0)
MCHC: 31.9 g/dL (ref 30.0–36.0)
MCV: 94.4 fL (ref 80.0–100.0)
Platelets: 176 10*3/uL (ref 150–400)
RBC: 3.72 MIL/uL — ABNORMAL LOW (ref 3.87–5.11)
RDW: 14.6 % (ref 11.5–15.5)
WBC: 15.6 10*3/uL — ABNORMAL HIGH (ref 4.0–10.5)
nRBC: 0 % (ref 0.0–0.2)

## 2019-07-07 LAB — TYPE AND SCREEN
ABO/RH(D): A POS
Antibody Screen: NEGATIVE

## 2019-07-07 LAB — ABO/RH: ABO/RH(D): A POS

## 2019-07-07 MED ORDER — ACETAMINOPHEN 325 MG PO TABS: 650.0000 mg | ORAL_TABLET | ORAL | Status: DC | PRN

## 2019-07-07 MED ORDER — FLEET ENEMA 7-19 GM/118ML RE ENEM: 1.0000 | ENEMA | RECTAL | Status: DC | PRN

## 2019-07-07 MED ORDER — LACTATED RINGERS IV SOLN
500.0000 mL | Freq: Once | INTRAVENOUS | Status: AC
  Administered 2019-07-07: 21:00:00 500 mL via INTRAVENOUS

## 2019-07-07 MED ORDER — OXYTOCIN 40 UNITS IN NORMAL SALINE INFUSION - SIMPLE MED
2.5000 [IU]/h | INTRAVENOUS | Status: DC
  Administered 2019-07-07: 22:00:00 2.5 [IU]/h via INTRAVENOUS
  Filled 2019-07-07: qty 1000

## 2019-07-07 MED ORDER — LACTATED RINGERS IV SOLN
INTRAVENOUS | Status: DC
  Administered 2019-07-07 (×2): via INTRAVENOUS

## 2019-07-07 MED ORDER — OXYTOCIN BOLUS FROM INFUSION
500.0000 mL | Freq: Once | INTRAVENOUS | Status: AC
  Administered 2019-07-07: 500 mL via INTRAVENOUS

## 2019-07-07 MED ORDER — EPHEDRINE 5 MG/ML INJ: 10.0000 mg | INTRAVENOUS | Status: DC | PRN

## 2019-07-07 MED ORDER — SOD CITRATE-CITRIC ACID 500-334 MG/5ML PO SOLN: 30.0000 mL | ORAL | Status: DC | PRN

## 2019-07-07 MED ORDER — PHENYLEPHRINE 40 MCG/ML (10ML) SYRINGE FOR IV PUSH (FOR BLOOD PRESSURE SUPPORT): 80.0000 ug | PREFILLED_SYRINGE | INTRAVENOUS | Status: DC | PRN

## 2019-07-07 MED ORDER — ONDANSETRON HCL 4 MG/2ML IJ SOLN: 4.0000 mg | Freq: Four times a day (QID) | INTRAMUSCULAR | Status: DC | PRN

## 2019-07-07 MED ORDER — DIPHENHYDRAMINE HCL 50 MG/ML IJ SOLN: 12.5000 mg | INTRAMUSCULAR | Status: DC | PRN

## 2019-07-07 MED ORDER — LACTATED RINGERS IV SOLN: 500.0000 mL | INTRAVENOUS | Status: DC | PRN

## 2019-07-07 MED ORDER — LIDOCAINE HCL (PF) 1 % IJ SOLN: 30.0000 mL | INTRAMUSCULAR | Status: DC | PRN

## 2019-07-07 MED ORDER — FENTANYL-BUPIVACAINE-NACL 0.5-0.125-0.9 MG/250ML-% EP SOLN: 12.0000 mL/h | EPIDURAL | Status: DC | PRN

## 2019-07-07 NOTE — Progress Notes (Signed)
EFM removed, pt plans to ambulate.  Pt instructed to ambulate on the unit until 1815. Instructed to return sooner if ROM or VB and call RN upon arrival back.

## 2019-07-07 NOTE — MAU Note (Signed)
Presents with ctxs every 3-5 minutes since noon.  Denies VB or LOF.  Endorses +FM.  States was seen @ office today was 4cms & membranes were stripped.

## 2019-07-07 NOTE — H&P (Signed)
Alison Duran is a 28 y.o. female presenting for SOL.  She reports increasing contractions this afternoon.  No vb or LOF.  Good FM OB History    Gravida  3   Para  2   Term  2   Preterm  0   AB  0   Living  2     SAB  0   TAB  0   Ectopic  0   Multiple  0   Live Births  2          Past Medical History:  Diagnosis Date  . Anemia    takes Iron twice daily  . GERD (gastroesophageal reflux disease)    with pregnancy only; takes no meds  . Hashimoto's disease   . Multiple thyroid nodules    Has to have them checked once a year   Past Surgical History:  Procedure Laterality Date  . SHOULDER SURGERY Right   . WISDOM TOOTH EXTRACTION     Family History: family history includes Hypertension in her father; Thyroid disease in her mother. Social History:  reports that she has never smoked. She has never used smokeless tobacco. She reports that she does not drink alcohol or use drugs.     Maternal Diabetes: No Genetic Screening: Normal Maternal Ultrasounds/Referrals: Isolated choroid plexus cyst Fetal Ultrasounds or other Referrals:  None Maternal Substance Abuse:  No Significant Maternal Medications:  None Significant Maternal Lab Results:  None Other Comments: h/o thyroiditis, no current meds  ROS History Dilation: 5 Effacement (%): 80 Station: -2 Exam by:: F. Morris, RNC Blood pressure 122/84, temperature 98.4 F (36.9 C), temperature source Oral, resp. rate 20, height 5\' 4"  (1.626 m), weight 67.5 kg, SpO2 99 %, currently breastfeeding. Exam Physical Exam  Gen - uncomfortable w/ ctx Abd - gravid, NT Ext - NT Cvx 5cm Prenatal labs: ABO, Rh: A/Positive/-- (01/30 0000) Antibody: Negative (01/30 0000) Rubella: Immune (01/30 0000) RPR: Nonreactive (01/30 0000)  HBsAg: Negative (01/30 0000)  HIV: Non-reactive (01/30 0000)  GBS: Negative (07/30 0000)   Assessment/Plan: Admit Epidural prn Exp mngt   Marylynn Pearson 07/07/2019, 6:56 PM

## 2019-07-07 NOTE — Progress Notes (Addendum)
Precipitous SVD of vigorous female infant w/ apgars of 8,9.  Infant delivered into bed RN upon my arrival  Placenta delivered spontaneous w/ 3VC.   No lacerations  Fundus firm.

## 2019-07-08 LAB — CBC
HCT: 33 % — ABNORMAL LOW (ref 36.0–46.0)
Hemoglobin: 10.6 g/dL — ABNORMAL LOW (ref 12.0–15.0)
MCH: 30.2 pg (ref 26.0–34.0)
MCHC: 32.1 g/dL (ref 30.0–36.0)
MCV: 94 fL (ref 80.0–100.0)
Platelets: 159 10*3/uL (ref 150–400)
RBC: 3.51 MIL/uL — ABNORMAL LOW (ref 3.87–5.11)
RDW: 14.7 % (ref 11.5–15.5)
WBC: 14 10*3/uL — ABNORMAL HIGH (ref 4.0–10.5)
nRBC: 0 % (ref 0.0–0.2)

## 2019-07-08 LAB — SARS CORONAVIRUS 2 (TAT 6-24 HRS): SARS Coronavirus 2: NEGATIVE

## 2019-07-08 LAB — RPR: RPR Ser Ql: NONREACTIVE

## 2019-07-08 MED ORDER — IBUPROFEN 600 MG PO TABS
600.0000 mg | ORAL_TABLET | Freq: Four times a day (QID) | ORAL | Status: DC | PRN
  Filled 2019-07-08: qty 1

## 2019-07-08 MED ORDER — ONDANSETRON HCL 4 MG/2ML IJ SOLN: 4.0000 mg | INTRAMUSCULAR | Status: DC | PRN

## 2019-07-08 MED ORDER — DIPHENHYDRAMINE HCL 25 MG PO CAPS: 25.0000 mg | ORAL_CAPSULE | Freq: Four times a day (QID) | ORAL | Status: DC | PRN

## 2019-07-08 MED ORDER — MEDROXYPROGESTERONE ACETATE 150 MG/ML IM SUSP: 150.0000 mg | INTRAMUSCULAR | Status: DC | PRN

## 2019-07-08 MED ORDER — DIBUCAINE (PERIANAL) 1 % EX OINT: 1.0000 "application " | TOPICAL_OINTMENT | CUTANEOUS | Status: DC | PRN

## 2019-07-08 MED ORDER — TETANUS-DIPHTH-ACELL PERTUSSIS 5-2.5-18.5 LF-MCG/0.5 IM SUSP: 0.5000 mL | Freq: Once | INTRAMUSCULAR | Status: DC

## 2019-07-08 MED ORDER — SIMETHICONE 80 MG PO CHEW
80.0000 mg | CHEWABLE_TABLET | ORAL | Status: DC | PRN
  Administered 2019-07-08: 80 mg via ORAL

## 2019-07-08 MED ORDER — MEASLES, MUMPS & RUBELLA VAC IJ SOLR: 0.5000 mL | Freq: Once | INTRAMUSCULAR | Status: DC

## 2019-07-08 MED ORDER — PRENATAL MULTIVITAMIN CH
1.0000 | ORAL_TABLET | Freq: Every day | ORAL | Status: DC
  Administered 2019-07-08 – 2019-07-09 (×2): 1 via ORAL
  Filled 2019-07-08 (×2): qty 1

## 2019-07-08 MED ORDER — SENNOSIDES-DOCUSATE SODIUM 8.6-50 MG PO TABS
2.0000 | ORAL_TABLET | ORAL | Status: DC
  Administered 2019-07-08 (×2): 2 via ORAL
  Filled 2019-07-08 (×2): qty 2

## 2019-07-08 MED ORDER — ONDANSETRON HCL 4 MG PO TABS: 4.0000 mg | ORAL_TABLET | ORAL | Status: DC | PRN

## 2019-07-08 MED ORDER — BENZOCAINE-MENTHOL 20-0.5 % EX AERO: 1.0000 "application " | INHALATION_SPRAY | CUTANEOUS | Status: DC | PRN

## 2019-07-08 MED ORDER — WITCH HAZEL-GLYCERIN EX PADS: 1.0000 "application " | MEDICATED_PAD | CUTANEOUS | Status: DC | PRN

## 2019-07-08 MED ORDER — IBUPROFEN 600 MG PO TABS
600.0000 mg | ORAL_TABLET | Freq: Four times a day (QID) | ORAL | Status: DC
  Administered 2019-07-08 – 2019-07-09 (×6): 600 mg via ORAL
  Filled 2019-07-08 (×6): qty 1

## 2019-07-08 MED ORDER — ACETAMINOPHEN 325 MG PO TABS: 650.0000 mg | ORAL_TABLET | ORAL | Status: DC | PRN

## 2019-07-08 MED ORDER — COCONUT OIL OIL: 1.0000 "application " | TOPICAL_OIL | Status: DC | PRN

## 2019-07-08 NOTE — Progress Notes (Signed)
Post Partum Day 1 Subjective: no complaints, up ad lib, voiding and tolerating PO  Objective: Blood pressure 109/81, pulse 84, temperature 97.9 F (36.6 C), temperature source Oral, resp. rate 18, height 5\' 4"  (1.626 m), weight 67.5 kg, SpO2 100 %, unknown if currently breastfeeding.  Physical Exam:  General: alert, cooperative and appears stated age Lochia: appropriate Uterine Fundus: firm Incision: healing well, no significant drainage, no dehiscence DVT Evaluation: No evidence of DVT seen on physical exam. Negative Homan's sign. No cords or calf tenderness.  Recent Labs    07/07/19 1854 07/08/19 0531  HGB 11.2* 10.6*  HCT 35.1* 33.0*    Assessment/Plan: Plan for discharge tomorrow and Breastfeeding   LOS: 1 day   Linda Hedges 07/08/2019, 1:42 PM

## 2019-07-08 NOTE — Lactation Note (Signed)
This note was copied from a baby's chart. Lactation Consultation Note  Patient Name: Alison Duran [id number] Date: 07/08/2019 Reason for consult: Initial assessment;Term  P3 mother whose infant is now 63 hours old.  Mother breast fed her first child (now 28 years old) for 35 months and her second child (now 38 years old) for 24 months.  Mother had no questions/concerns related to breast feeding.  Baby has fed multiple times since delivery.  Mother's breasts are soft and non tender and nipples are everted and intact.  During our visit baby started showing feeding cues.  I observed mother latching baby to the breast and made many recommendations.  First, I suggested removing the swaddle prior to latching.  Mother was using the cradle hold and I suggested how to support baby better in this position.  Baby was latching onto the nipple tip only and I assisted to latch deep into breast tissue.  Mother noticed a difference when placed better on breast.  She immediately covered baby up after latching and baby stopped sucking.  Suggested mother feed STS without cover.  Showed mother how to do breast compressions and to stay away from making an "air hole" next to baby's mouth during feeding.  Mother appreciative of help.  Mother is familiar with hand expression and encouraged this before/after feedings to help increase milk supply.  Colostrum container provided and milk storage times reviewed.  Finger feeding demonstrated. Mother has a DEBP for home use.  Mother will call for any further questions/concerns.  Father present.  Mom made aware of O/P services, breastfeeding support groups, community resources, and our phone # for post-discharge questions.    Maternal Data Formula Feeding for Exclusion: No Has patient been taught Hand Expression?: Yes Does the patient have breastfeeding experience prior to this delivery?: Yes  Feeding Feeding Type: Breast Fed  LATCH Score Latch: Grasps breast easily,  tongue down, lips flanged, rhythmical sucking.  Audible Swallowing: None  Type of Nipple: Everted at rest and after stimulation  Comfort (Breast/Nipple): Soft / non-tender  Hold (Positioning): Assistance needed to correctly position infant at breast and maintain latch.  LATCH Score: 7  Interventions Interventions: Breast feeding basics reviewed;Assisted with latch;Skin to skin;Breast massage;Hand express;Breast compression;Adjust position;Position options;Support pillows  Lactation Tools Discussed/Used WIC Program: No   Consult Status Consult Status: Follow-up Date: 07/09/19 Follow-up type: In-patient    Delbert Darley R Isadore Bokhari 07/08/2019, 10:58 AM

## 2019-07-09 NOTE — Discharge Instructions (Signed)

## 2019-07-09 NOTE — Lactation Note (Signed)
This note was copied from a baby's chart. Lactation Consultation Note  Patient Name: Alison Duran Date: 07/09/2019 Reason for consult: Follow-up assessment  Mom says breastfeeding "is awesome." She said she has utilized the pointers given by the previous LC with good results. Infant has excellent output.  Mom mentioned that when her milk comes in she tends to have an abundant supply and her let-down reflex "shoots them in the back of the throat." I reminded Mom to do laid-back nursing if that happens & Mom said she remembered having done laid-back nursing with her previous children.   Matthias Hughs [facility] 07/09/2019, 8:44 AM

## 2019-07-09 NOTE — Discharge Summary (Signed)
Obstetric Discharge Summary Reason for Admission: onset of labor Prenatal Procedures: none Intrapartum Procedures: spontaneous vaginal delivery Postpartum Procedures: none Complications-Operative and Postpartum: none Hemoglobin  Date Value Ref Range Status  07/08/2019 10.6 (L) 12.0 - 15.0 g/dL Final   HCT  Date Value Ref Range Status  07/08/2019 33.0 (L) 36.0 - 46.0 % Final    Physical Exam:  General: alert, cooperative, appears stated age and no distress Lochia: appropriate Uterine Fundus: firm Incision: healing well DVT Evaluation: No evidence of DVT seen on physical exam.  Discharge Diagnoses: Term Pregnancy-delivered  Discharge Information: Date: 07/09/2019 Activity: pelvic rest Diet: routine Medications: PNV Condition: stable Instructions: refer to practice specific booklet Discharge to: home   Newborn Data: Live born female  Birth Weight: 6 lb 12 oz (3062 g) APGAR: 6, 9  Newborn Delivery   Birth date/time: 07/07/2019 21:51:00 Delivery type: Vaginal, Spontaneous      Home with mother.  Alison Duran 07/09/2019, 9:45 AM

## 2019-07-10 ENCOUNTER — Other Ambulatory Visit (HOSPITAL_COMMUNITY)
Admission: RE | Admit: 2019-07-10 | Discharge: 2019-07-10 | Disposition: A | Discharge status: Home / Self Care | Payer: BC Managed Care – PPO | Source: Ambulatory Visit | Attending: Obstetrics and Gynecology | Admitting: Obstetrics and Gynecology

## 2019-07-12 ENCOUNTER — Inpatient Hospital Stay (HOSPITAL_COMMUNITY): Admission: AD | Admit: 2019-07-12 | Payer: BC Managed Care – PPO | Source: Home / Self Care

## 2019-07-12 ENCOUNTER — Inpatient Hospital Stay (HOSPITAL_COMMUNITY): Payer: BC Managed Care – PPO

## 2019-09-09 ENCOUNTER — Ambulatory Visit (INDEPENDENT_AMBULATORY_CARE_PROVIDER_SITE_OTHER): Payer: BC Managed Care – PPO | Admitting: Internal Medicine

## 2019-09-13 ENCOUNTER — Ambulatory Visit (INDEPENDENT_AMBULATORY_CARE_PROVIDER_SITE_OTHER): Payer: BC Managed Care – PPO | Admitting: Internal Medicine

## 2019-09-13 ENCOUNTER — Other Ambulatory Visit: Payer: Self-pay

## 2019-09-13 ENCOUNTER — Encounter (INDEPENDENT_AMBULATORY_CARE_PROVIDER_SITE_OTHER): Payer: Self-pay | Admitting: Internal Medicine

## 2019-09-13 VITALS — BP 120/80 | HR 72 | Ht 64.0 in | Wt 136.6 lb

## 2019-09-13 DIAGNOSIS — R7989 Other specified abnormal findings of blood chemistry: Secondary | ICD-10-CM

## 2019-09-13 DIAGNOSIS — D649 Anemia, unspecified: Secondary | ICD-10-CM

## 2019-09-13 DIAGNOSIS — E559 Vitamin D deficiency, unspecified: Secondary | ICD-10-CM | POA: Diagnosis not present

## 2019-09-13 HISTORY — DX: Vitamin D deficiency, unspecified: E55.9

## 2019-09-13 NOTE — Progress Notes (Signed)
Metrics: Intervention Frequency ACO  Documented Smoking Status Yearly  Screened one or more times in 24 months  Cessation Counseling or  Active cessation medication Past 24 months  Past 24 months   Guideline developer: UpToDate (See UpToDate for funding source) Date Released: 2014       Wellness Office Visit  Subjective:  Patient ID: Alison Duran, female    DOB: January 11, 1991  Age: 28 y.o. MRN: [id number]  CC: This lady comes in about 6 weeks postpartum now.  She is come to review her abnormal blood work which includes hyperlipidemia, low free T3 levels. HPI  She was also found to be anemic and had iron infusions throughout the pregnancy at some point.  She did have previously symptoms of cold intolerance, lack of focus and concentration but these seem to have improved to some degree.  She has been taking vitamin D3 6000 units daily. Past Medical History:  Diagnosis Date  . Anemia    takes Iron twice daily  . GERD (gastroesophageal reflux disease)    with pregnancy only; takes no meds  . Hashimoto's disease   . Multiple thyroid nodules    Has to have them checked once a year  . Vitamin D deficiency disease 09/13/2019      Family History  Problem Relation Age of Onset  . Hypertension Father   . 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   Social History   Tobacco Use  . Smoking status: Never Smoker  . Smokeless tobacco: Never Used  Substance Use Topics  . Alcohol use: No    Current Meds  Medication Sig  . Cholecalciferol (VITAMIN D3) LIQD Take 6,000 Units by mouth daily.  . Prenatal Vit-Fe Fumarate-FA (PRENATAL MULTIVITAMIN) TABS tablet Take 1 tablet by mouth daily at 12 noon.   . Probiotic Product (PROBIOTIC ADVANCED) CAPS Take 1 capsule by mouth daily.      Objective:   Today's  Vitals: BP 120/80   Pulse 72   Ht 5\' 4"  (1.626 m)   Wt 136 lb 9.6 oz (62 kg)   BMI 23.45 kg/m  Vitals with BMI 09/13/2019 07/09/2019 07/08/2019  Height 5\' 4"  - -  Weight 136 lbs 10 oz - -  BMI 25.05 - -  Systolic [phone]  Diastolic 80 75 76  Pulse 72 85 78     Physical Exam  She looks systemically well.  No new physical findings today.     Assessment   1. Anemia, unspecified type   2. Vitamin D deficiency disease   3. Low serum triiodothyronine (T3)       Tests ordered Orders Placed This Encounter  Procedures  . CBC  . Ferritin  . VITAMIN D 25 Hydroxy (Vit-D Deficiency, Fractures)  . T3, free  . TSH  . T4  . Iron and TIBC     Plan: 1. Blood work is ordered as above. 2. Further recommendations will depend on those results.   No orders of the defined types were placed in this encounter.   Doree Albee, MD

## 2019-09-14 LAB — CBC
HCT: 38.7 % (ref 35.0–45.0)
Hemoglobin: 13 g/dL (ref 11.7–15.5)
MCH: 29.7 pg (ref 27.0–33.0)
MCHC: 33.6 g/dL (ref 32.0–36.0)
MCV: 88.6 fL (ref 80.0–100.0)
MPV: 10.9 fL (ref 7.5–12.5)
Platelets: 221 10*3/uL (ref 140–400)
RBC: 4.37 10*6/uL (ref 3.80–5.10)
RDW: 12.5 % (ref 11.0–15.0)
WBC: 6.5 10*3/uL (ref 3.8–10.8)

## 2019-09-14 LAB — T4: T4, Total: 6.8 ug/dL (ref 5.1–11.9)

## 2019-09-14 LAB — FERRITIN: Ferritin: 139 ng/mL (ref 16–154)

## 2019-09-14 LAB — IRON, TOTAL/TOTAL IRON BINDING CAP
%SAT: 36 % (calc) (ref 16–45)
Iron: 105 ug/dL (ref 40–190)
TIBC: 288 mcg/dL (calc) (ref 250–450)

## 2019-09-14 LAB — VITAMIN D 25 HYDROXY (VIT D DEFICIENCY, FRACTURES): Vit D, 25-Hydroxy: 43 ng/mL (ref 30–100)

## 2019-09-14 LAB — TSH: TSH: 1.38 mIU/L

## 2019-09-14 LAB — T3, FREE: T3, Free: 2.9 pg/mL (ref 2.3–4.2)

## 2019-12-02 ENCOUNTER — Ambulatory Visit (INDEPENDENT_AMBULATORY_CARE_PROVIDER_SITE_OTHER): Payer: BC Managed Care – PPO | Admitting: Internal Medicine

## 2020-01-17 ENCOUNTER — Ambulatory Visit (INDEPENDENT_AMBULATORY_CARE_PROVIDER_SITE_OTHER): Payer: BC Managed Care – PPO | Admitting: Internal Medicine
